# Patient Record
Sex: Male | Born: 1945 | Race: White | Hispanic: No | Smoking: Never smoker
Health system: Southern US, Community
[De-identification: ages and names within clinical notes are randomized; demographics above are authoritative.]

## PROBLEM LIST (undated history)

## (undated) DIAGNOSIS — Q211 Atrial septal defect, unspecified: Secondary | ICD-10-CM

## (undated) DIAGNOSIS — I639 Cerebral infarction, unspecified: Secondary | ICD-10-CM

## (undated) DIAGNOSIS — I10 Essential (primary) hypertension: Secondary | ICD-10-CM

## (undated) HISTORY — DX: Atrial septal defect, unspecified: Q21.10

## (undated) HISTORY — DX: Essential (primary) hypertension: I10

## (undated) HISTORY — DX: Cerebral infarction, unspecified: I63.9

## (undated) HISTORY — DX: Atrial septal defect: Q21.1

## (undated) HISTORY — PX: SPLENECTOMY: SUR1306

## (undated) HISTORY — PX: BACK SURGERY: SHX140

## (undated) HISTORY — PX: KNEE SURGERY: SHX244

---

## 2018-02-01 DIAGNOSIS — I639 Cerebral infarction, unspecified: Secondary | ICD-10-CM

## 2018-02-02 DIAGNOSIS — I639 Cerebral infarction, unspecified: Secondary | ICD-10-CM | POA: Diagnosis not present

## 2018-02-02 DIAGNOSIS — I6789 Other cerebrovascular disease: Secondary | ICD-10-CM | POA: Diagnosis not present

## 2018-02-03 DIAGNOSIS — I6789 Other cerebrovascular disease: Secondary | ICD-10-CM | POA: Diagnosis not present

## 2018-02-03 DIAGNOSIS — I639 Cerebral infarction, unspecified: Secondary | ICD-10-CM | POA: Diagnosis not present

## 2018-02-15 DIAGNOSIS — R471 Dysarthria and anarthria: Secondary | ICD-10-CM | POA: Diagnosis not present

## 2018-02-16 ENCOUNTER — Telehealth: Payer: Self-pay | Admitting: Neurology

## 2018-02-16 DIAGNOSIS — R471 Dysarthria and anarthria: Secondary | ICD-10-CM | POA: Diagnosis not present

## 2018-02-16 NOTE — Telephone Encounter (Signed)
I was called by Dr. Thedore MinsSingh in [facility] and asking for new referral to see this pt earlier after discharge. I told him that we can schedule on 4/16 or 4/17. He is fine with it. He has cardiology appointment on 03/03/15.   Hi, Kathie RhodesBetty or Lurena JoinerRebecca, could you please schedule this pt with me on 4/16 or 4/17? Thanks much.   Marvel PlanJindong Abri Vacca, MD PhD Stroke Neurology 02/16/2018 8:37 AM

## 2018-03-01 DIAGNOSIS — I639 Cerebral infarction, unspecified: Secondary | ICD-10-CM | POA: Insufficient documentation

## 2018-03-01 DIAGNOSIS — Q211 Atrial septal defect, unspecified: Secondary | ICD-10-CM | POA: Insufficient documentation

## 2018-03-01 DIAGNOSIS — I10 Essential (primary) hypertension: Secondary | ICD-10-CM | POA: Insufficient documentation

## 2018-03-02 ENCOUNTER — Ambulatory Visit: Payer: Medicare Other | Admitting: Physician Assistant

## 2018-03-02 ENCOUNTER — Encounter: Payer: Self-pay | Admitting: Physician Assistant

## 2018-03-02 VITALS — BP 118/80 | HR 69 | Ht 69.0 in | Wt 178.8 lb

## 2018-03-02 DIAGNOSIS — I10 Essential (primary) hypertension: Secondary | ICD-10-CM

## 2018-03-02 DIAGNOSIS — I639 Cerebral infarction, unspecified: Secondary | ICD-10-CM

## 2018-03-02 DIAGNOSIS — Q211 Atrial septal defect, unspecified: Secondary | ICD-10-CM

## 2018-03-02 MED ORDER — METOPROLOL TARTRATE 50 MG PO TABS: ORAL_TABLET | ORAL | 0 refills | Status: DC

## 2018-03-02 NOTE — Patient Instructions (Addendum)
Medication Instructions:  Your physician recommends that you continue on your current medications as directed. Please refer to the Current Medication list given to you today.  Labwork: Your physician recommends that you return for lab work one week prior to cardiac CT for BMET.  Testing/Procedures: Your physician has requested that you have cardiac CT. Cardiac computed tomography (CT) is a painless test that uses an x-ray machine to take clear, detailed pictures of your heart. For further information please visit [email or web address] Please follow instruction sheet as given.  Follow-Up: Your physician wants you to follow-up with Dr. Excell Seltzerooper, his nurse will call you with an appointment.  Your physician recommends that you schedule a follow-up appointment in lipid clinic.  If you need a refill on your cardiac medications before your next appointment, please call your pharmacy.   Please arrive at the [facility] main entrance of [facility] at xx:xx AM (30-45 minutes prior to test start time)  [facility] [address] [place], [place] [address] [phone]  Proceed to the [facility] Radiology Department (First Floor).  Please follow these instructions carefully (unless otherwise directed):  On the Night Before the Test: . Drink plenty of water. . Do not consume any caffeinated/decaffeinated beverages or chocolate 12 hours prior to your test. . Do not take any antihistamines 12 hours prior to your test. . If not on a Beta Blocker- Take 50 mg of lopressor (metoprolol) the evening before test. .  On the Day of the Test: . Drink plenty of water. Do not drink any water within one hour of the test. . Do not eat any food 4 hours prior to the test. . You may take your regular medications prior to the test. . IF NOT ON A BETA BLOCKER - Take 50 mg of lopressor (metoprolol) one hour before the test.  After the Test: . Drink plenty of water. . After receiving IV  contrast, you may experience a mild flushed feeling. This is normal. . On occasion, you may experience a mild rash up to 24 hours after the test. This is not dangerous. If this occurs, you can take Benadryl 25 mg and increase your fluid intake. . If you experience trouble breathing, this can be serious. If it is severe call 911 IMMEDIATELY. If it is mild, please call our office.

## 2018-03-02 NOTE — Progress Notes (Signed)
Cardiology Office Note    Date:  03/02/2018   ID:  Marcus Wise, DOB 1945-11-28, MRN [id number]  PCP:  Doreene Elandhomas, Millard B, MD  Cardiologist: No primary care provider on file.  Chief Complaint  Patient presents with  . New Patient (Initial Visit)    History of Present Illness:  Marcus Wise is a 72 y.o. 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  He was initial scratch that he was placed on Eliquis then switched to Xarelto and a statin as well as lisinopril.  [facility] with abnormal gait and balance as well as visual disturbance.  CT of the head MRI and MRA were unremarkable.  Ig no acute changes.  He was already on Xarelto and will follow up with Dr. Roda ShuttersXU.  He had resolution of his symptoms.  [facility]D [facility] L[facility]F 55-60% with mild enlargement of the right atrium and moderate second atrial septal defect measuring 11 and 19 mm in diameter.  TEE small sinus venous ASD measuring under 10 mm, small second him ASD measuring under 10 mm.  Carotid duplex minimal to moderate bilateral plaque not hemodynamically significant.  Lower extremity Dopplers no DVT.  Creatinine 1.3 hemoglobin A1c 5.3.  Patient comes in today accompanied by his daughter.  He is very active and runs 1-2 miles interval training every other day.  He has never smoked.  Denies chest pain, pressure, dyspnea, dyspnea on exertion dizziness or presyncope.  Never knew he had a heart murmur until now.  Complains of diarrhea on Lipitor.    Past Medical History:  Diagnosis Date  . ASD (atrial septal defect)   . 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 Hypertension     History reviewed. No pertinent surgical history.  Current Medications: Current Meds  Medication Sig  . atorvastatin (LIPITOR) 80 MG tablet Take  80 mg by mouth daily.  . ramipril (ALTACE) 10 MG capsule Take 1 capsule by mouth daily.  . rivaroxaban (XARELTO) 20 MG TABS tablet Take 20 mg by mouth daily with supper.     Allergies:   Patient has no allergy information on record.   Social History   Socioeconomic History  . Marital status: Married    Spouse name: Not on file  . Number of children: Not on file  . Years of education: Not on file  . Highest education level: Not on file  Occupational History  . Not on file  Social Needs  . Financial resource strain: Not on file  . Food insecurity:    Worry: Not on file    Inability: Not on file  . Transportation needs:    Medical: Not on file    Non-medical: Not on file  Tobacco Use  . Smoking status: Never Smoker  . Smokeless tobacco: Never Used  Substance and Sexual Activity  . Alcohol use: Not on file  . Drug use: Not on file  . Sexual activity: Not on file  Lifestyle  . Physical activity:    Days per week: Not on file    Minutes per session: Not on file  . Stress: Not on file  Relationships  . Social connections:    Talks on phone: Not on file    Gets together: Not on file    Attends religious  service: Not on file    Active member of club or organization: Not on file    Attends meetings of clubs or organizations: Not on file    Relationship status: Not on file  Other Topics Concern  . Not on file  Social History Narrative  . Not on file     Family History:  The patient's family history includes Heart attack in his father.   ROS:   Please see the history of present illness.    Review of Systems  Constitution: Negative.  HENT: Negative.   Cardiovascular: Negative.   Respiratory: Negative.   Endocrine: Negative.   Hematologic/Lymphatic: Negative.   Musculoskeletal: Negative.   Gastrointestinal: Positive for diarrhea.  Genitourinary: Negative.   Neurological: Negative.    All other systems reviewed and are negative.   PHYSICAL EXAM:   VS:  BP 118/80    Pulse 69   Ht 5\' 9"  (1.753 m)   Wt 178 lb 12.8 oz (81.1 kg)   SpO2 97%   BMI 26.40 kg/m   Physical Exam  GEN: Well nourished, well developed, in no acute distress  Neck: no JVD, carotid bruits, or masses Cardiac:RRR; 2/6 systolic murmur at the left sternal border Respiratory:  clear to auscultation bilaterally, normal work of breathing GI: soft, nontender, nondistended, + BS Ext: without cyanosis, clubbing, or edema, Good distal pulses bilaterally Neuro:  Alert and Oriented x 3,  Psych: euthymic mood, full affect  Wt Readings from Last 3 Encounters:  03/02/18 178 lb 12.8 oz (81.1 kg)      Studies/Labs Reviewed:   EKG:  EKG is not ordered today.  The ekg reviewed from 02/15/18 sinus bradycardia at 54 bpm with incomplete right bundle branch block otherwise normal Recent Labs: No results found for requested labs within last 8760 hours.   Lipid Panel No results found for: CHOL, TRIG, HDL, CHOLHDL, VLDL, LDLCALC, LDLDIRECT  Additional studies/ records that were reviewed today include:  Records reviewed from [facility].  We will be obtaining actual 2D echo and TEE to review in detail.  CT carotid Dopplers and MRI were also reviewed.    ASSESSMENT:    1. ASD (atrial septal defect)   2. Cerebrovascular accident (CVA), unspecified mechanism (HCC)   3. Essential hypertension      PLAN:  In order of problems listed above:  ASD small sinus venous ASD measuring under 10 mm and small secondum ASD under 10 mm found on TEE and 2D echo at [facility] in the setting of small CVA.  Patient currently on Xarelto and Lipitor.  Discussed with Dr. Delton See in detail.  She said the small sinus venous ASD is difficult to see on TEE and recommends CT scan and follow-up with Dr. Excell Seltzer to discuss possible closure.  CVA 02/06/18 small right posterior frontal, recurrent symptoms 02/16/18 but no change in CT or MRI or MRA.  Dr. Roda Shutters recommended continuing Xarelto and Lipitor.  Patient  having trouble on Lipitor with diarrhea.  Will refer to our lipid clinic to discuss alternatives.  Hypertension well controlled on ramipril.  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  Last creatinine 1.3.  Followed by Dr. Geanie Berlin who says that is probably normal for his muscle mass according to the patient.  Will watch closely with CT scan    Medication Adjustments/Labs and Tests Ordered: Current medicines are reviewed at length with the patient today.  Concerns regarding medicines are outlined above.  Medication changes, Labs and Tests ordered today are  listed in the Patient Instructions below. Patient Instructions  Medication Instructions:  Your physician recommends that you continue on your current medications as directed. Please refer to the Current Medication list given to you today.  Labwork: Your physician recommends that you return for lab work one week prior to cardiac CT for BMET.  Testing/Procedures: Your physician has requested that you have cardiac CT. Cardiac computed tomography (CT) is a painless test that uses an x-ray machine to take clear, detailed pictures of your heart. For further information please visit [email or web address] Please follow instruction sheet as given.  Follow-Up: Your physician wants you to follow-up with Dr. Excell Seltzer, his nurse will call you with an appointment.  Your physician recommends that you schedule a follow-up appointment in lipid clinic.  If you need a refill on your cardiac medications before your next appointment, please call your pharmacy.   Please arrive at the [facility] main entrance of [facility] at xx:xx AM (30-45 minutes prior to test start time)  [facility] [address] [place], [place] [address] [phone]  Proceed to the [facility] Radiology Department (First Floor).  Please follow these instructions carefully (unless otherwise directed):  On the Night Before the Test: . Drink plenty of  water. . Do not consume any caffeinated/decaffeinated beverages or chocolate 12 hours prior to your test. . Do not take any antihistamines 12 hours prior to your test. . If not on a Beta Blocker- Take 50 mg of lopressor (metoprolol) the evening before test. .  On the Day of the Test: . Drink plenty of water. Do not drink any water within one hour of the test. . Do not eat any food 4 hours prior to the test. . You may take your regular medications prior to the test. . IF NOT ON A BETA BLOCKER - Take 50 mg of lopressor (metoprolol) one hour before the test.  After the Test: . Drink plenty of water. . After receiving IV contrast, you may experience a mild flushed feeling. This is normal. . On occasion, you may experience a mild rash up to 24 hours after the test. This is not dangerous. If this occurs, you can take Benadryl 25 mg and increase your fluid intake. . If you experience trouble breathing, this can be serious. If it is severe call 911 IMMEDIATELY. If it is mild, please call our office.     Elson Clan, PA-C  03/02/2018 12:53 PM    [facility] [address] [place], [place], [place]  [address] Phone: [phone]; Fax: [phone]

## 2018-03-03 ENCOUNTER — Other Ambulatory Visit: Payer: Self-pay | Admitting: *Deleted

## 2018-03-03 MED ORDER — ATORVASTATIN CALCIUM 80 MG PO TABS: 80.0000 mg | ORAL_TABLET | Freq: Every day | ORAL | 1 refills | Status: DC

## 2018-03-03 MED ORDER — RIVAROXABAN 20 MG PO TABS: 20.0000 mg | ORAL_TABLET | Freq: Every day | ORAL | 1 refills | Status: DC

## 2018-03-09 ENCOUNTER — Ambulatory Visit: Payer: Self-pay | Admitting: Neurology

## 2018-03-19 NOTE — Progress Notes (Signed)
TEE and surface echo images reviewed. There is clearly an ASD in the secundum portion of the septum. It is not clear to me whether there is a sinus venosus ASD. I recommend a gated cardiac CTA to better evaluate for anomalous veins and integrity of interatrial septum. I would like to see in consultation after the CTA is completed. thx

## 2018-03-25 ENCOUNTER — Encounter: Payer: Self-pay | Admitting: Pharmacist

## 2018-03-25 ENCOUNTER — Other Ambulatory Visit: Payer: Self-pay | Admitting: Cardiovascular Disease

## 2018-03-25 ENCOUNTER — Ambulatory Visit (INDEPENDENT_AMBULATORY_CARE_PROVIDER_SITE_OTHER): Payer: Medicare Other | Admitting: Pharmacist

## 2018-03-25 ENCOUNTER — Other Ambulatory Visit: Payer: Medicare Other | Admitting: *Deleted

## 2018-03-25 DIAGNOSIS — E785 Hyperlipidemia, unspecified: Secondary | ICD-10-CM | POA: Insufficient documentation

## 2018-03-25 DIAGNOSIS — Q211 Atrial septal defect, unspecified: Secondary | ICD-10-CM

## 2018-03-25 LAB — BASIC METABOLIC PANEL
BUN/Creatinine Ratio: 15 (ref 10–24)
BUN: 18 mg/dL (ref 8–27)
CHLORIDE: 102 mmol/L (ref 96–106)
CO2: 22 mmol/L (ref 20–29)
Calcium: 9.9 mg/dL (ref 8.6–10.2)
Creatinine, Ser: 1.21 mg/dL (ref 0.76–1.27)
GFR calc Af Amer: 69 mL/min/{1.73_m2} (ref >59)
GFR calc non Af Amer: 59 mL/min/{1.73_m2} — ABNORMAL LOW (ref >59)
GLUCOSE: 97 mg/dL (ref 65–99)
POTASSIUM: 4.4 mmol/L (ref 3.5–5.2)
Sodium: 138 mmol/L (ref 134–144)

## 2018-03-25 MED ORDER — ROSUVASTATIN CALCIUM 40 MG PO TABS: 40.0000 mg | ORAL_TABLET | Freq: Every day | ORAL | 3 refills | Status: DC

## 2018-03-25 NOTE — Progress Notes (Signed)
Patient ID: Marcus Wise                 DOB: 1945/12/27                    MRN: [id number]     HPI: Marcus Wise is a 72 y.o. male patient of Dr. Excell Seltzer that presents today for lipid evaluation.  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 He has reported diarrhea with atorvastatin.   He presents today for discussion of cholesterol. He reports that since starting the atorvastatin he has had diarrhea. He has been eating more cheese and dairy to combat this. 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 He reports some itching on his trunk, but no noticeable rash with atorvastatin as well. Despite all of this he has been compliant with the medication and is willing to continue if necessary.  He does also note that he is much more tired than usual and is unsure if this is related to the medication or the diarrhea caused by the medication.   He also is interested in learning about diets that are both good for sugars and for cholesterol.    Risk Factors: CVA, HTN LDL Goal: <70  Current Medications: atorvastatin  daily Intolerances: atorvastatin (diarrhea)  Diet: Does eat hamburger mostly. He does eat a lot of salads. He does eat herron more recently. He eats broccoli and apples. Drinks water and occasional Gatorade and chocolate milk.   Exercise: He runs, plays racket ball, yard work.   Family History: Heart attack in his father.  Social History: denies tobacco - never used. 1-2 ounces per day.   Labs: 03/15/18: TC 117, TG 173, HDL 41, LDL 41 (atorvastatin  daily)  Past Medical History:  Diagnosis Date  . ASD (atrial septal defect)   . 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 Hypertension     Current Outpatient Medications on File Prior  to Visit  Medication Sig Dispense Refill  . atorvastatin (LIPITOR) 80 MG tablet Take 1 tablet (80 mg total) by mouth daily. 30 tablet 1  . metoprolol tartrate (LOPRESSOR) 50 MG tablet Take one tablet the night before Cardiac CT and Take one tablet the morning of Cardiac CT. 2 tablet 0  . ramipril (ALTACE) 10 MG capsule Take 1 capsule by mouth daily.    . rivaroxaban (XARELTO) 20 MG TABS tablet Take 1 tablet (20 mg total) by mouth daily with supper. 90 tablet 1   No current facility-administered medications on file prior to visit.     Not on File  Assessment/Plan: Hyperlipidemia: LDL at goal on atorvastatin. However will discontinue atorvastatin therapy due to GI symptoms. Will start rosuvastatin  daily (which tends to be less associated with diarrhea and more associated with constipation). Advised he call with tolerance issues and especially if itching gets worse with medication change. He believes that his primary MD will order lipid panel with follow up visit, but he is aware to call if not so that we can check labs for him.   Will also forward along information on ketogenic diet when available.   Thank you,  Freddie Apley. Cleatis Polka, PharmD  [facility]  03/25/2018 8:50 AM

## 2018-03-25 NOTE — Patient Instructions (Addendum)
STOP atorvastatin  START rosuvastatin (Crestor)  daily   Call [phone] with any issues or question. Call us in 2-3 months to have lipid panel ordered if not planned at primary doctor.    Cholesterol Cholesterol is a fat. Your body needs a small amount of cholesterol. Cholesterol (plaque) may build up in your blood vessels (arteries). That makes you more likely to have a heart attack or stroke. You cannot feel your cholesterol level. Having a blood test is the only way to find out if your level is high. Keep your test results. Work with your doctor to keep your cholesterol at a good level. What do the results mean?  Total cholesterol is how much cholesterol is in your blood.  LDL is bad cholesterol. This is the type that can build up. Try to have low LDL.  HDL is good cholesterol. It cleans your blood vessels and carries LDL away. Try to have high HDL.  Triglycerides are fat that the body can store or burn for energy. What are good levels of cholesterol?  Total cholesterol below 200.  LDL below 100 is good for people who have health risks. LDL below 70 is good for people who have very high risks.  HDL above 40 is good. It is best to have HDL of 60 or higher.  Triglycerides below 150. How can I lower my cholesterol? Diet Follow your diet program as told by your doctor.  Choose fish, white meat chicken, or [place] that is roasted or baked. Try not to eat red meat, fried foods, sausage, or lunch meats.  Eat lots of fresh fruits and vegetables.  Choose whole grains, beans, pasta, potatoes, and cereals.  Choose olive oil, corn oil, or canola oil. Only use small amounts.  Try not to eat butter, mayonnaise, shortening, or palm kernel oils.  Try not to eat foods with trans fats.  Choose low-fat or nonfat dairy foods. ? Drink skim or nonfat milk. ? Eat low-fat or nonfat yogurt and cheeses. ? Try not to drink whole milk or cream. ? Try not to eat ice cream, egg yolks, or  full-fat cheeses.  Healthy desserts include angel food cake, ginger snaps, animal crackers, hard candy, popsicles, and low-fat or nonfat frozen yogurt. Try not to eat pastries, cakes, pies, and cookies.  Exercise Follow your exercise program as told by your doctor.  Be more active. Try gardening, walking, and taking the stairs.  Ask your doctor about ways that you can be more active.  Medicine  Take over-the-counter and prescription medicines only as told by your doctor. This information is not intended to replace advice given to you by your health care provider. Make sure you discuss any questions you have with your health care provider. Document Released: 02/06/2009 Document Revised: 06/11/2016 Document Reviewed: 05/22/2016 Elsevier Interactive Patient Education  [employer].

## 2018-03-26 NOTE — Progress Notes (Signed)
Pt has been made aware of normal result and verbalized understanding.  jw 03/26/18

## 2018-04-01 ENCOUNTER — Encounter (INDEPENDENT_AMBULATORY_CARE_PROVIDER_SITE_OTHER): Payer: Self-pay

## 2018-04-08 ENCOUNTER — Ambulatory Visit (HOSPITAL_COMMUNITY): Payer: Medicare Other

## 2018-04-12 ENCOUNTER — Ambulatory Visit (HOSPITAL_COMMUNITY)
Admission: RE | Admit: 2018-04-12 | Discharge: 2018-04-12 | Disposition: A | Discharge status: Home / Self Care | Payer: Medicare Other | Source: Ambulatory Visit | Attending: Physician Assistant | Admitting: Physician Assistant

## 2018-04-12 ENCOUNTER — Ambulatory Visit (HOSPITAL_COMMUNITY): Admission: RE | Admit: 2018-04-12 | Payer: Medicare Other | Source: Ambulatory Visit

## 2018-04-12 DIAGNOSIS — Q211 Atrial septal defect, unspecified: Secondary | ICD-10-CM

## 2018-04-12 DIAGNOSIS — R079 Chest pain, unspecified: Secondary | ICD-10-CM | POA: Diagnosis not present

## 2018-04-12 MED ORDER — IOPAMIDOL (ISOVUE-370) INJECTION 76%
INTRAVENOUS | Status: AC
  Filled 2018-04-12: qty 100

## 2018-04-12 MED ORDER — NITROGLYCERIN 0.4 MG SL SUBL
SUBLINGUAL_TABLET | SUBLINGUAL | Status: AC
  Administered 2018-04-12: 0.8 mg
  Filled 2018-04-12: qty 2

## 2018-04-12 MED ORDER — IOPAMIDOL (ISOVUE-370) INJECTION 76%
80.0000 mL | Freq: Once | INTRAVENOUS | Status: AC | PRN
  Administered 2018-04-12: 60 mL via INTRAVENOUS

## 2018-04-13 ENCOUNTER — Telehealth: Payer: Self-pay

## 2018-04-13 NOTE — Telephone Encounter (Signed)
Tonny Bollman, MD  Henrietta Dine, RN        Thanks let's set him up for consult and ASD closure.      Called to schedule consult and tentative ASD repair 6/20. Left message to call back.

## 2018-04-13 NOTE — Telephone Encounter (Signed)
Scheduled patient for consult with Dr. Excell Seltzer 6/7. He was grateful for call and agrees with treatment plan.

## 2018-04-13 NOTE — Telephone Encounter (Signed)
-----   Message from Dyann Kief, PA-C sent at 04/13/2018  7:55 AM EDT ----- Let patient know calcium score is 0, no coronary disease and small secundum ASD. Recent CVA. Needs appt with Dr. Excell Seltzer. Thanks

## 2018-04-26 ENCOUNTER — Ambulatory Visit (HOSPITAL_COMMUNITY): Payer: Medicare Other

## 2018-04-30 ENCOUNTER — Ambulatory Visit: Payer: Medicare Other | Admitting: Cardiovascular Disease

## 2018-04-30 ENCOUNTER — Encounter: Payer: Self-pay | Admitting: Cardiovascular Disease

## 2018-04-30 VITALS — BP 158/90 | HR 69 | Ht 69.0 in | Wt 186.0 lb

## 2018-04-30 DIAGNOSIS — Q211 Atrial septal defect, unspecified: Secondary | ICD-10-CM

## 2018-04-30 MED ORDER — ASPIRIN EC 81 MG PO TBEC: 81.0000 mg | DELAYED_RELEASE_TABLET | Freq: Every day | ORAL | 3 refills | Status: DC

## 2018-04-30 NOTE — H&P (View-Only) (Signed)
Cardiology Office Note Date:  04/30/2018   ID:  Marcus Wise, DOB January 05, 1946, MRN [id number]  PCP:  [name], [name], PA  Cardiologist:  Tonny BollmanMichael Breda Bond, MD    Chief Complaint  Patient presents with  . Atrial Septal Defect     History of Present Illness: Marcus Wise is a 72 y.o. male who presents for evaluation of an atrial septal defect.   He presented with left arm numbness and left leg clumsiness and was diagnosed with a stroke in March 2019. He has been on rivaroxaban since that time. He had a DVT in 2018 and was treated with oral anticoagulation for 3-6 months (he doesn't remember duration). 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   The patient is here with his daughter today.  He is doing relatively well and tolerating oral anticoagulation with rivaroxaban without bleeding problems.  He has had trouble tolerating a statin drug and he is recently reduce the dose by 50% now feeling a little better.  He describes leg pain and weakness.  He denies chest pain or shortness of breath.  He denies lightheadedness or syncope.  The patient has no history of heart murmur, cardiac surgery, or previous cardiac evaluation.  He was initially diagnosed with an atrial septal defect just recently when he presented with his stroke.  There is a family history of ASD in his niece.  Past Medical History:  Diagnosis Date  . ASD (atrial septal defect)   . 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 Hypertension     Past Surgical History:  Procedure Laterality Date  . BACK SURGERY    . KNEE SURGERY    . SPLENECTOMY      Current Outpatient Medications  Medication Sig Dispense Refill  . ramipril (ALTACE) 10 MG capsule Take 1 capsule by mouth daily.    . rivaroxaban (XARELTO) 20 MG TABS tablet Take 1 tablet (20 mg total) by mouth daily with supper. 90 tablet 1  .  rosuvastatin (CRESTOR) 40 MG tablet Take 1 tablet (40 mg total) by mouth daily. 90 tablet 3  . aspirin EC 81 MG tablet Take 1 tablet (81 mg total) by mouth daily. 90 tablet 3   No current facility-administered medications for this visit.     Allergies:   Patient has no allergy information on record.   Social History:  The patient  reports that he has never smoked. He has never used smokeless tobacco.   Family History:  The patient's  family history includes Heart attack in his father.    ROS:  Please see the history of present illness.   Positive for diarrhea, muscle cramps, rash, dizziness, fatigue, leg pain, heart palpitations, anxiety, balance problems.  All other systems are reviewed and negative.    PHYSICAL EXAM: VS:  BP (!) 158/90   Pulse 69   Ht 5\' 9"  (1.753 m)   Wt 186 lb (84.4 kg)   SpO2 98%   BMI 27.47 kg/m  , BMI Body mass index is 27.47 kg/m. GEN: Well nourished, well developed, in no acute distress  HEENT: normal  Neck: no JVD, no masses. No carotid bruits Cardiac: RRR without murmur or gallop                Respiratory:  clear to auscultation bilaterally, normal work of breathing GI: soft,  nontender, nondistended, + BS MS: no deformity or atrophy  Ext: no pretibial edema, pedal pulses 2+= bilaterally Skin: warm and dry, no rash Neuro:  Strength and sensation are intact Psych: euthymic mood, full affect  EKG:  EKG is not ordered today.  Recent Labs: 03/25/2018: BUN 18; Creatinine, Ser 1.21; Potassium 4.4; Sodium 138   Lipid Panel  No results found for: CHOL, TRIG, HDL, CHOLHDL, VLDL, LDLCALC, LDLDIRECT    Wt Readings from Last 3 Encounters:  04/30/18 186 lb (84.4 kg)  03/02/18 178 lb 12.8 oz (81.1 kg)     Cardiac Studies Reviewed: 2D echocardiogram dated 02/02/2018 demonstrates normal LV systolic function, a moderate secundum ASD, mild RV and RA enlargement.  Gated cardiac CTA demonstrates a coronary artery calcium score of 0, no coronary artery  disease, a secundum ASD measuring 10x10 mm, and no anomalous pulmonary vein drainage.  ASSESSMENT AND PLAN: Secundum ASD: The patient appears to have a hemodynamically significant atrial septal defect with evidence of right-sided cardiac chamber enlargement.  I have personally reviewed the images from his surface echocardiogram, transesophageal echocardiogram, and gated cardiac CTA.  He appears to have a 10 mm secundum ASD.  There is no evidence of anomalous pulmonary vein drainage.  The TEE raised the question of a venosus type defect, but I do not appreciate any convincing color-flow evidence of this.  This is not visualized on cardiac CTA.  I reviewed management options with the patient.  We discussed risks, indications, and alternatives to transcatheter ASD closure.  I demonstrated the device to him today.  He understands risks of serious complications such as stroke, heart attack, device embolization, cardiac perforation and tamponade, emergency cardiac surgery, serious arrhythmia, and death occur at a rate of approximately 1%.  He would like to move forward and schedule the procedure as soon as possible.  He will continue on rivaroxaban and I have asked him to hold this the day before and day of the procedure.  We will add aspirin 81 mg for a period of about 3 months.  Will defer to his outpatient neurologist and primary physician regarding long-term anticoagulation. For his ASD device he will require antiplatelet Rx with ASA for a period of 3 months.   Current medicines are reviewed with the patient today.  The patient does not have concerns regarding medicines.  Labs/ tests ordered today include:   Orders Placed This Encounter  Procedures  . Basic metabolic panel  . CBC with Differential/Platelet    Disposition:   To be scheduled for transcatheter ASD closure.  Signed, Tonny Bollman, MD  04/30/2018 5:30 PM    [facility] [address] [place], [place], [place]   [address] Phone: [phone]; Fax: [phone]

## 2018-04-30 NOTE — Progress Notes (Signed)
Cardiology Office Note Date:  04/30/2018   ID:  Marcus Wise, DOB January 05, 1946, MRN [id number]  PCP:  Marcus CorporalYates, [name], PA  Cardiologist:  Marcus BollmanMichael Deseri Loss, MD    Chief Complaint  Patient presents with  . Atrial Septal Defect     History of Present Illness: Marcus Wise is a 72 y.o. male who presents for evaluation of an atrial septal defect.   He presented with left arm numbness and left leg clumsiness and was diagnosed with a stroke in March 2019. He has been on rivaroxaban since that time. He had a DVT in 2018 and was treated with oral anticoagulation for 3-6 months (he doesn't remember duration). 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   The patient is here with his daughter today.  He is doing relatively well and tolerating oral anticoagulation with rivaroxaban without bleeding problems.  He has had trouble tolerating a statin drug and he is recently reduce the dose by 50% now feeling a little better.  He describes leg pain and weakness.  He denies chest pain or shortness of breath.  He denies lightheadedness or syncope.  The patient has no history of heart murmur, cardiac surgery, or previous cardiac evaluation.  He was initially diagnosed with an atrial septal defect just recently when he presented with his stroke.  There is a family history of ASD in his niece.  Past Medical History:  Diagnosis Date  . ASD (atrial septal defect)   . 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 Hypertension     Past Surgical History:  Procedure Laterality Date  . BACK SURGERY    . KNEE SURGERY    . SPLENECTOMY      Current Outpatient Medications  Medication Sig Dispense Refill  . ramipril (ALTACE) 10 MG capsule Take 1 capsule by mouth daily.    . rivaroxaban (XARELTO) 20 MG TABS tablet Take 1 tablet (20 mg total) by mouth daily with supper. 90 tablet 1  .  rosuvastatin (CRESTOR) 40 MG tablet Take 1 tablet (40 mg total) by mouth daily. 90 tablet 3  . aspirin EC 81 MG tablet Take 1 tablet (81 mg total) by mouth daily. 90 tablet 3   No current facility-administered medications for this visit.     Allergies:   Patient has no allergy information on record.   Social History:  The patient  reports that he has never smoked. He has never used smokeless tobacco.   Family History:  The patient's  family history includes Heart attack in his father.    ROS:  Please see the history of present illness.   Positive for diarrhea, muscle cramps, rash, dizziness, fatigue, leg pain, heart palpitations, anxiety, balance problems.  All other systems are reviewed and negative.    PHYSICAL EXAM: VS:  BP (!) 158/90   Pulse 69   Ht 5\' 9"  (1.753 m)   Wt 186 lb (84.4 kg)   SpO2 98%   BMI 27.47 kg/m  , BMI Body mass index is 27.47 kg/m. GEN: Well nourished, well developed, in no acute distress  HEENT: normal  Neck: no JVD, no masses. No carotid bruits Cardiac: RRR without murmur or gallop                Respiratory:  clear to auscultation bilaterally, normal work of breathing GI: soft,  nontender, nondistended, + BS MS: no deformity or atrophy  Ext: no pretibial edema, pedal pulses 2+= bilaterally Skin: warm and dry, no rash Neuro:  Strength and sensation are intact Psych: euthymic mood, full affect  EKG:  EKG is not ordered today.  Recent Labs: 03/25/2018: BUN 18; Creatinine, Ser 1.21; Potassium 4.4; Sodium 138   Lipid Panel  No results found for: CHOL, TRIG, HDL, CHOLHDL, VLDL, LDLCALC, LDLDIRECT    Wt Readings from Last 3 Encounters:  04/30/18 186 lb (84.4 kg)  03/02/18 178 lb 12.8 oz (81.1 kg)     Cardiac Studies Reviewed: 2D echocardiogram dated 02/02/2018 demonstrates normal LV systolic function, a moderate secundum ASD, mild RV and RA enlargement.  Gated cardiac CTA demonstrates a coronary artery calcium score of 0, no coronary artery  disease, a secundum ASD measuring 10x10 mm, and no anomalous pulmonary vein drainage.  ASSESSMENT AND PLAN: Secundum ASD: The patient appears to have a hemodynamically significant atrial septal defect with evidence of right-sided cardiac chamber enlargement.  I have personally reviewed the images from his surface echocardiogram, transesophageal echocardiogram, and gated cardiac CTA.  He appears to have a 10 mm secundum ASD.  There is no evidence of anomalous pulmonary vein drainage.  The TEE raised the question of a venosus type defect, but I do not appreciate any convincing color-flow evidence of this.  This is not visualized on cardiac CTA.  I reviewed management options with the patient.  We discussed risks, indications, and alternatives to transcatheter ASD closure.  I demonstrated the device to him today.  He understands risks of serious complications such as stroke, heart attack, device embolization, cardiac perforation and tamponade, emergency cardiac surgery, serious arrhythmia, and death occur at a rate of approximately 1%.  He would like to move forward and schedule the procedure as soon as possible.  He will continue on rivaroxaban and I have asked him to hold this the day before and day of the procedure.  We will add aspirin 81 mg for a period of about 3 months.  Will defer to his outpatient neurologist and primary physician regarding long-term anticoagulation. For his ASD device he will require antiplatelet Rx with ASA for a period of 3 months.   Current medicines are reviewed with the patient today.  The patient does not have concerns regarding medicines.  Labs/ tests ordered today include:   Orders Placed This Encounter  Procedures  . Basic metabolic panel  . CBC with Differential/Platelet    Disposition:   To be scheduled for transcatheter ASD closure.  Signed, Marcus Bollman, MD  04/30/2018 5:30 PM    [facility] [address] [place], [place], [place]   [address] Phone: [phone]; Fax: [phone]

## 2018-04-30 NOTE — Patient Instructions (Addendum)
Medication Instructions:  1) START ASPIRIN 81 mg daily  Labwork: TODAY: BMET, CBC  Testing/Procedures: Dr. Excell Seltzerooper recommends you have an ASD CLOSURE on May 13, 2018. Please see page 2 for more details.  Follow-Up: You have a 1 MONTH  follow-up appointment scheduled 06/10/2018 at 1:30PM.  Any Other Special Instructions Will Be Listed Below (If Applicable).   [address] [facility] [address] OFFICE [address], [address] [place] [address] Dept: [phone] Loc: [phone]  Macie BurowsJames Y Kimmey  04/30/2018  You are scheduled for a, ASD CLOSURE on Thursday, June 20 with Dr. Tonny BollmanMichael Cooper.  1. [facility]: [address] [place], [place] [address] at 7:30 AM (two hours before your procedure to ensure your preparation). Free valet parking service is available.   Special note: Every effort is made to have your procedure done on time. Please understand that emergencies sometimes delay scheduled procedures.  2. Diet: Drink plenty of water the day before your test! Do not eat any solid foods after midnight the night before. You may have clear liquids until 5:00AM the morning of your procedure.  3. Labs: Today!  4. Medication instructions in preparation for your procedure:  1) HOLD XARELTO the day before and day of your procedure.  2) Take your ASPIRIN and other medications as directed with sips of water.  5. Plan for one night stay--bring personal belongings. 6. Bring a current list of your medications and current insurance cards. 7. You MUST have a responsible person to drive you home. 8. Someone MUST be with you the first 24 hours after you arrive home or your discharge will be delayed. 9. Please wear clothes that are easy to get on and off and wear slip-on shoes.  Thank you for allowing us to care for you!   -- [facility]  [facility]     If you need a refill on your cardiac medications before your next appointment, please call your pharmacy.

## 2018-05-01 LAB — CBC WITH DIFFERENTIAL/PLATELET
BASOS ABS: 0 10*3/uL (ref 0.0–0.2)
Basos: 0 %
EOS (ABSOLUTE): 0.2 10*3/uL (ref 0.0–0.4)
Eos: 4 %
HEMOGLOBIN: 15 g/dL (ref 13.0–17.7)
Hematocrit: 43.9 % (ref 37.5–51.0)
IMMATURE GRANS (ABS): 0 10*3/uL (ref 0.0–0.1)
IMMATURE GRANULOCYTES: 0 %
LYMPHS: 28 %
Lymphocytes Absolute: 1.9 10*3/uL (ref 0.7–3.1)
MCH: 33.3 pg — AB (ref 26.6–33.0)
MCHC: 34.2 g/dL (ref 31.5–35.7)
MCV: 97 fL (ref 79–97)
Monocytes Absolute: 1.1 10*3/uL — ABNORMAL HIGH (ref 0.1–0.9)
Monocytes: 15 %
NEUTROS PCT: 53 %
Neutrophils Absolute: 3.7 10*3/uL (ref 1.4–7.0)
PLATELETS: 371 10*3/uL (ref 150–450)
RBC: 4.51 x10E6/uL (ref 4.14–5.80)
RDW: 14.8 % (ref 12.3–15.4)
WBC: 6.9 10*3/uL (ref 3.4–10.8)

## 2018-05-01 LAB — BASIC METABOLIC PANEL
BUN/Creatinine Ratio: 12 (ref 10–24)
BUN: 20 mg/dL (ref 8–27)
CALCIUM: 9.6 mg/dL (ref 8.6–10.2)
CHLORIDE: 105 mmol/L (ref 96–106)
CO2: 18 mmol/L — AB (ref 20–29)
Creatinine, Ser: 1.7 mg/dL — ABNORMAL HIGH (ref 0.76–1.27)
GFR calc non Af Amer: 39 mL/min/{1.73_m2} — ABNORMAL LOW (ref >59)
GFR, EST AFRICAN AMERICAN: 46 mL/min/{1.73_m2} — AB (ref >59)
Glucose: 103 mg/dL — ABNORMAL HIGH (ref 65–99)
POTASSIUM: 3.6 mmol/L (ref 3.5–5.2)
Sodium: 141 mmol/L (ref 134–144)

## 2018-05-11 ENCOUNTER — Telehealth: Payer: Self-pay | Admitting: *Deleted

## 2018-05-11 NOTE — Telephone Encounter (Addendum)
Pt contacted pre-ASD closure scheduled [facility] for: Thursday May 13, 2018 9:30 AM Ver[facility] at: 7:30 AM  No solid food after midnight prior to cath, clear liquids until 5 AM day of procedure. Verified allergies in Epic Verified no diabetes medications.  Hold: Xarelto-day before and day of procedure. Ramipril -hold until post procedure  Except hold medications AM meds can be  taken pre-cath with sip of water including: ASA 81 mg  Confirmed patient has responsible person to drive home post procedure and observe patient for 24 hours: yes  Discussed with patient Dr Earmon Phoenixooper's recommendation to drink plenty of fluids between now and procedure.   Per Dr Larence Penningooper-pt does not need to go in early for pre-procedure hydration-ASD is "no contrast" procedure, repeat BMP when he comes in for ASD closure.

## 2018-05-13 ENCOUNTER — Ambulatory Visit (HOSPITAL_BASED_OUTPATIENT_CLINIC_OR_DEPARTMENT_OTHER): Payer: Medicare Other

## 2018-05-13 ENCOUNTER — Ambulatory Visit (HOSPITAL_COMMUNITY)
Admission: RE | Admit: 2018-05-13 | Discharge: 2018-05-13 | Disposition: A | Discharge status: Home / Self Care | Payer: Medicare Other | Source: Ambulatory Visit | Attending: Cardiovascular Disease | Admitting: Cardiovascular Disease

## 2018-05-13 ENCOUNTER — Encounter (HOSPITAL_COMMUNITY)
Admission: RE | Disposition: A | Discharge status: Home / Self Care | Payer: Self-pay | Source: Ambulatory Visit | Attending: Cardiovascular Disease

## 2018-05-13 DIAGNOSIS — Z7901 Long term (current) use of anticoagulants: Secondary | ICD-10-CM | POA: Diagnosis not present

## 2018-05-13 DIAGNOSIS — I10 Essential (primary) hypertension: Secondary | ICD-10-CM | POA: Diagnosis not present

## 2018-05-13 DIAGNOSIS — Q211 Atrial septal defect, unspecified: Secondary | ICD-10-CM

## 2018-05-13 DIAGNOSIS — Z8673 Personal history of transient ischemic attack (TIA), and cerebral infarction without residual deficits: Secondary | ICD-10-CM | POA: Diagnosis not present

## 2018-05-13 DIAGNOSIS — Z7982 Long term (current) use of aspirin: Secondary | ICD-10-CM | POA: Insufficient documentation

## 2018-05-13 DIAGNOSIS — Z8249 Family history of ischemic heart disease and other diseases of the circulatory system: Secondary | ICD-10-CM | POA: Insufficient documentation

## 2018-05-13 DIAGNOSIS — Z86718 Personal history of other venous thrombosis and embolism: Secondary | ICD-10-CM | POA: Insufficient documentation

## 2018-05-13 HISTORY — PX: ATRIAL SEPTAL DEFECT(ASD) CLOSURE: CATH118299

## 2018-05-13 LAB — BASIC METABOLIC PANEL
Anion gap: 7 (ref 5–15)
BUN: 21 mg/dL — ABNORMAL HIGH (ref 6–20)
CALCIUM: 9.5 mg/dL (ref 8.9–10.3)
CO2: 24 mmol/L (ref 22–32)
Chloride: 108 mmol/L (ref 101–111)
Creatinine, Ser: 1.22 mg/dL (ref 0.61–1.24)
GFR calc Af Amer: >60 mL/min (ref >60)
GFR, EST NON AFRICAN AMERICAN: 57 mL/min — AB (ref >60)
GLUCOSE: 102 mg/dL — AB (ref 65–99)
POTASSIUM: 3.7 mmol/L (ref 3.5–5.1)
SODIUM: 139 mmol/L (ref 135–145)

## 2018-05-13 LAB — POCT ACTIVATED CLOTTING TIME
ACTIVATED CLOTTING TIME: 252 s
Activated Clotting Time: 208 seconds
Activated Clotting Time: 208 seconds
Activated Clotting Time: 191 seconds
Activated Clotting Time: 191 seconds

## 2018-05-13 LAB — ECHOCARDIOGRAM LIMITED
HEIGHTINCHES: 69 in
Weight: 2912 oz

## 2018-05-13 SURGERY — ATRIAL SEPTAL DEFECT (ASD) CLOSURE
Anesthesia: LOCAL

## 2018-05-13 MED ORDER — SODIUM CHLORIDE 0.9 % WEIGHT BASED INFUSION
3.0000 mL/kg/h | INTRAVENOUS | Status: AC
  Administered 2018-05-13: 3 mL/kg/h via INTRAVENOUS

## 2018-05-13 MED ORDER — SODIUM CHLORIDE 0.9% FLUSH: 3.0000 mL | Freq: Two times a day (BID) | INTRAVENOUS | Status: DC

## 2018-05-13 MED ORDER — SODIUM CHLORIDE 0.9 % IV SOLN: 250.0000 mL | INTRAVENOUS | Status: DC | PRN

## 2018-05-13 MED ORDER — CEFAZOLIN SODIUM-DEXTROSE 2-3 GM-%(50ML) IV SOLR
INTRAVENOUS | Status: DC | PRN
  Administered 2018-05-13: 2 g via INTRAVENOUS

## 2018-05-13 MED ORDER — CEFAZOLIN SODIUM-DEXTROSE 2-4 GM/100ML-% IV SOLN
INTRAVENOUS | Status: AC
  Filled 2018-05-13: qty 100

## 2018-05-13 MED ORDER — SODIUM CHLORIDE 0.9 % WEIGHT BASED INFUSION: 1.0000 mL/kg/h | INTRAVENOUS | Status: DC

## 2018-05-13 MED ORDER — LIDOCAINE HCL (PF) 1 % IJ SOLN
INTRAMUSCULAR | Status: AC
  Filled 2018-05-13: qty 30

## 2018-05-13 MED ORDER — HEPARIN SODIUM (PORCINE) 1000 UNIT/ML IJ SOLN
INTRAMUSCULAR | Status: DC | PRN
  Administered 2018-05-13: 4000 [IU] via INTRAVENOUS
  Administered 2018-05-13: 6000 [IU] via INTRAVENOUS

## 2018-05-13 MED ORDER — SODIUM CHLORIDE 0.9% FLUSH: 3.0000 mL | INTRAVENOUS | Status: DC | PRN

## 2018-05-13 MED ORDER — FENTANYL CITRATE (PF) 100 MCG/2ML IJ SOLN
INTRAMUSCULAR | Status: AC
  Filled 2018-05-13: qty 2

## 2018-05-13 MED ORDER — ACETAMINOPHEN 325 MG PO TABS: 650.0000 mg | ORAL_TABLET | ORAL | Status: DC | PRN

## 2018-05-13 MED ORDER — HEPARIN (PORCINE) IN NACL 2-0.9 UNITS/ML
INTRAMUSCULAR | Status: AC | PRN
  Administered 2018-05-13: 1000 mL

## 2018-05-13 MED ORDER — MIDAZOLAM HCL 2 MG/2ML IJ SOLN
INTRAMUSCULAR | Status: AC
  Filled 2018-05-13: qty 2

## 2018-05-13 MED ORDER — RAMIPRIL 10 MG PO CAPS
10.0000 mg | ORAL_CAPSULE | Freq: Every day | ORAL | Status: DC
  Administered 2018-05-13: 10 mg via ORAL
  Filled 2018-05-13: qty 1

## 2018-05-13 MED ORDER — HEPARIN (PORCINE) IN NACL 1000-0.9 UT/500ML-% IV SOLN
INTRAVENOUS | Status: AC
  Filled 2018-05-13: qty 1000

## 2018-05-13 MED ORDER — CEFAZOLIN SODIUM-DEXTROSE 2-4 GM/100ML-% IV SOLN: 2.0000 g | INTRAVENOUS | Status: DC

## 2018-05-13 MED ORDER — ASPIRIN 81 MG PO CHEW: 81.0000 mg | CHEWABLE_TABLET | ORAL | Status: DC

## 2018-05-13 MED ORDER — ONDANSETRON HCL 4 MG/2ML IJ SOLN: 4.0000 mg | Freq: Four times a day (QID) | INTRAMUSCULAR | Status: DC | PRN

## 2018-05-13 MED ORDER — LIDOCAINE HCL (PF) 1 % IJ SOLN
INTRAMUSCULAR | Status: DC | PRN
  Administered 2018-05-13: 20 mL

## 2018-05-13 MED ORDER — HEPARIN SODIUM (PORCINE) 1000 UNIT/ML IJ SOLN
INTRAMUSCULAR | Status: AC
  Filled 2018-05-13: qty 1

## 2018-05-13 MED ORDER — MIDAZOLAM HCL 2 MG/2ML IJ SOLN
INTRAMUSCULAR | Status: DC | PRN
  Administered 2018-05-13: 1 mg via INTRAVENOUS
  Administered 2018-05-13: 2 mg via INTRAVENOUS

## 2018-05-13 MED ORDER — FENTANYL CITRATE (PF) 100 MCG/2ML IJ SOLN
INTRAMUSCULAR | Status: DC | PRN
  Administered 2018-05-13 (×2): 25 ug via INTRAVENOUS

## 2018-05-13 SURGICAL SUPPLY — 18 items
BALLN SIZING AMPLATZER 24 (BALLOONS) ×2
BALLOON SIZING AMPLATZER 24 (BALLOONS) ×1 IMPLANT
CATH ACUNAV 8FR 90CM (CATHETERS) ×2 IMPLANT
CATH EXPO 5F MPA-1 (CATHETERS) ×2 IMPLANT
CATH INFINITI 5FR MPB2 (CATHETERS) ×2 IMPLANT
COVER PRB 48X5XTLSCP FOLD TPE (BAG) ×1 IMPLANT
COVER PROBE 5X48 (BAG) ×1
COVER SWIFTLINK CONNECTOR (BAG) ×2 IMPLANT
GUIDEWIRE AMPLATZER 1.5JX260 (WIRE) ×2 IMPLANT
GUIDEWIRE ANGLED .035X150CM (WIRE) ×2 IMPLANT
OCCLUDER AMPLATZER SEPTAL 12MM (Prosthesis & Implant Heart) ×2 IMPLANT
PACK CARDIAC CATHETERIZATION (CUSTOM PROCEDURE TRAY) ×2 IMPLANT
PROTECTION STATION PRESSURIZED (MISCELLANEOUS) ×2
SHEATH INTROD W/O MIN 9FR 25CM (SHEATH) ×2 IMPLANT
SHEATH PINNACLE 8F 10CM (SHEATH) ×2 IMPLANT
STATION PROTECTION PRESSURIZED (MISCELLANEOUS) ×1 IMPLANT
SYSTEM DELIVERY AMPLATZER 8FR (SHEATH) ×2 IMPLANT
WIRE EMERALD 3MM-J .035X150CM (WIRE) ×2 IMPLANT

## 2018-05-13 NOTE — Progress Notes (Signed)
Report and Care Re[facility] from [facility]. Patient resting at this time and states no pain. VSS. Rt groin soft with no active bleeding or hematoma noted. Will continue to monitor patient.

## 2018-05-13 NOTE — Interval H&P Note (Signed)
History and Physical Interval Note:  05/13/2018 9:29 AM  Macie BurowsJames Y Culbreth  has presented today for surgery, with the diagnosis of ASD  The various methods of treatment have been discussed with the patient and family. After consideration of risks, benefits and other options for treatment, the patient has consented to  Procedure(s): ATRIAL SEPTAL DEFECT (ASD) CLOSURE (N/A) as a surgical intervention .  The patient's history has been reviewed, patient examined, no change in status, stable for surgery.  I have reviewed the patient's chart and labs.  Questions were answered to the patient's satisfaction.     Tonny BollmanMichael Vyron Fronczak

## 2018-05-13 NOTE — Progress Notes (Signed)
  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  Leta JunglingCooper, Alexcis Bicking M 05/13/2018, 1:38 PM

## 2018-05-13 NOTE — Progress Notes (Signed)
Site area: rt groin fv sheaths x2 Site Prior to Removal:  Level 0 Pressure Applied For: 20 minutes Manual:   yes Patient Status During Pull:  stable Post Pull Site:  Level 0 Post Pull Instructions Given:  yes Post Pull Pulses Present:  Rt pt palpable Dressing Applied:  Gauze and tegaderm Bedrest begins @ 1245 Comments:

## 2018-05-13 NOTE — Discharge Instructions (Signed)
Angiogram, Care After °This sheet gives you information about how to care for yourself after your procedure. Your health care provider may also give you more specific instructions. If you have problems or questions, contact your health care provider. °What can I expect after the procedure? °After the procedure, it is common to have bruising and tenderness at the catheter insertion area. °Follow these instructions at home: °Insertion site care °· Follow instructions from your health care provider about how to take care of your insertion site. Make sure you: °? Wash your hands with soap and water before you change your bandage (dressing). If soap and water are not available, use hand sanitizer. °? Change your dressing as told by your health care provider. °? Leave stitches (sutures), skin glue, or adhesive strips in place. These skin closures may need to stay in place for 2 weeks or longer. If adhesive strip edges start to loosen and curl up, you may trim the loose edges. Do not remove adhesive strips completely unless your health care provider tells you to do that. °· Do not take baths, swim, or use a hot tub until your health care provider approves. °· You may shower 24-48 hours after the procedure or as told by your health care provider. °? Gently wash the site with plain soap and water. °? Pat the area dry with a clean towel. °? Do not rub the site. This may cause bleeding. °· Do not apply powder or lotion to the site. Keep the site clean and dry. °· Check your insertion site every day for signs of infection. Check for: °? Redness, swelling, or pain. °? Fluid or blood. °? Warmth. °? Pus or a bad smell. °Activity °· Rest as told by your health care provider, usually for 1-2 days. °· Do not lift anything that is heavier than 10 lbs. (4.5 kg) or as told by your health care provider. °· Do not drive for 24 hours if you were given a medicine to help you relax (sedative). °· Do not drive or use heavy machinery while  taking prescription pain medicine. °General instructions °· Return to your normal activities as told by your health care provider, usually in about a week. Ask your health care provider what activities are safe for you. °· If the catheter site starts bleeding, lie flat and put pressure on the site. If the bleeding does not stop, get help right away. This is a medical emergency. °· Drink enough fluid to keep your urine clear or pale yellow. This helps flush the contrast dye from your body. °· Take over-the-counter and prescription medicines only as told by your health care provider. °· Keep all follow-up visits as told by your health care provider. This is important. °Contact a health care provider if: °· You have a fever or chills. °· You have redness, swelling, or pain around your insertion site. °· You have fluid or blood coming from your insertion site. °· The insertion site feels warm to the touch. °· You have pus or a bad smell coming from your insertion site. °· You have bruising around the insertion site. °· You notice blood collecting in the tissue around the catheter site (hematoma). The hematoma may be painful to the touch. °Get help right away if: °· You have severe pain at the catheter insertion area. °· The catheter insertion area swells very fast. °· The catheter insertion area is bleeding, and the bleeding does not stop when you hold steady pressure on the area. °·   The area near or just beyond the catheter insertion site becomes pale, cool, tingly, or numb. These symptoms may represent a serious problem that is an emergency. Do not wait to see if the symptoms will go away. Get medical help right away. Call your local emergency services (911 in the [place] Do not drive yourself to the hospital. Summary  After the procedure, it is common to have bruising and tenderness at the catheter insertion area.  After the procedure, it is important to rest and drink plenty of fluids.  Do not take baths,  swim, or use a hot tub until your health care provider says it is okay to do so. You may shower 24-48 hours after the procedure or as told by your health care provider.  If the catheter site starts bleeding, lie flat and put pressure on the site. If the bleeding does not stop, get help right away. This is a medical emergency. This information is not intended to replace advice given to you by your health care provider. Make sure you discuss any questions you have with your health care provider. Document Released: 05/29/2005 Document Revised: 10/15/2016 Document Reviewed: 10/15/2016 Elsevier Interactive Patient Education  2018 [employer] Groin Site Care Refer to this sheet in the next few weeks. These instructions provide you with information on caring for yourself after your procedure. Your caregiver may also give you more specific instructions. Your treatment has been planned according to current medical practices, but problems sometimes occur. Call your caregiver if you have any problems or questions after your procedure. HOME CARE INSTRUCTIONS  You may shower 24 hours after the procedure. Remove the bandage (dressing) and gently wash the site with plain soap and water. Gently pat the site dry.   Do not apply powder or lotion to the site.   Do not sit in a bathtub, swimming pool, or whirlpool for 5 to 7 days.   No bending, squatting, or lifting anything over 10 pounds (4.5 kg) as directed by your caregiver.   Inspect the site at least twice daily.   Do not drive home if you are discharged the same day of the procedure. Have someone else drive you.   You may drive 24 hours after the procedure unless otherwise instructed by your caregiver.  What to expect:  Any bruising will usually fade within 1 to 2 weeks.   Blood that collects in the tissue (hematoma) may be painful to the touch. It should usually decrease in size and tenderness within 1 to 2 weeks.  SEEK IMMEDIATE MEDICAL CARE  IF:  You have unusual pain at the groin site or down the affected leg.   You have redness, warmth, swelling, or pain at the groin site.   You have drainage (other than a small amount of blood on the dressing).   You have chills.   You have a fever or persistent symptoms for more than 72 hours.   You have a fever and your symptoms suddenly get worse.   Your leg becomes pale, cool, tingly, or numb.  You have heavy bleeding from the site. Hold pressure on the site. .Marland Kitchen

## 2018-05-13 NOTE — Progress Notes (Addendum)
pts bp elevated 152/ 98 x 1 hr, hr 45-47. Lillia AbedLindsay PA cardiology was paged for update.  bp med order followed.  1655 spoke with Lillia AbedLindsay PA about bp/ meds, pt may be dc as ordered.

## 2018-05-14 ENCOUNTER — Encounter (HOSPITAL_COMMUNITY): Payer: Self-pay | Admitting: Cardiovascular Disease

## 2018-06-08 NOTE — Progress Notes (Signed)
[facility]   MULTIDISCIPLINARY HEART VALVE CLINIC                                       Cardiology Office Note    Date:  06/10/2018   ID:  Marcus Wise, DOB 07-16-1946, MRN [id number]  PCP:  Marlyn Corporal, PA  Cardiologist: Tonny Bollman, MD    CC: 1 month s/p ASD  History of Present Illness:  Marcus Wise is a 72 y.o. 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 He presented with left arm numbness and left leg clumsiness and was diagnosed with a stroke in 01/2018. 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 He saw Dr. Excell Seltzer for evaluation and underwent successful ASD closure with a 12mm Amplatzer septal occluder device on 05/13/18. Follow up echo showed a well seated device with no flow, EF 45-50%.  Today he presents to clinic for follow up.  He can really tell a difference since his surgery. He feels like he has better exercise since having his ASD closure. He is very active and exercises everyday. No CP or SOB. No LE edema, orthopnea or PND. No dizziness or syncope. No blood in stool or urine. No palpitations. No new neurologic symptoms. He is very grateful for the care he got at [facility].     Past Medical History:  Diagnosis Date  . ASD (atrial septal defect)   . 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 Hypertension     Past Surgical History:  Procedure Laterality Date  . ATRIAL SEPTAL DEFECT(ASD) CLOSURE N/A 05/13/2018   Procedure: ATRIAL SEPTAL DEFECT (ASD) CLOSURE;  Surgeon: Tonny Bollman, MD;  Location: [facility] INVASIVE CV  LAB;  Service: Cardiovascular;  Laterality: N/A;  . BACK SURGERY    . KNEE SURGERY    . SPLENECTOMY      Current Medications: Outpatient Medications Prior to Visit  Medication Sig Dispense Refill  . aspirin EC 81 MG tablet Take 1 tablet (81 mg total) by mouth daily. 90 tablet 3  . Coenzyme Q10 (CO Q-10) 200 MG CAPS Take 200 mg by mouth daily.    . Misc Natural Products (CORNSILK PO) Take 1 tablet by mouth daily.    . ramipril (ALTACE) 10 MG capsule Take 10 mg by mouth daily.     . rivaroxaban (XARELTO) 20 MG TABS tablet Take 1 tablet (20 mg total) by mouth daily with supper. 90 tablet 1  . pravastatin (PRAVACHOL) 40 MG tablet Take 40 mg by mouth at bedtime.     No facility-administered medications prior to visit.      Allergies:   Patient has no known allergies.   Social History   Socioeconomic History  . Marital status: Married    Spouse name: Not on file  . Number of children: Not on file  . Years of education: Not on file  . Highest education level:  Not on file  Occupational History  . Not on file  Social Needs  . Financial resource strain: Not on file  . Food insecurity:    Worry: Not on file    Inability: Not on file  . Transportation needs:    Medical: Not on file    Non-medical: Not on file  Tobacco Use  . Smoking status: Never Smoker  . Smokeless tobacco: Never Used  Substance and Sexual Activity  . Alcohol use: Not on file  . Drug use: Not on file  . Sexual activity: Not on file  Lifestyle  . Physical activity:    Days per week: Not on file    Minutes per session: Not on file  . Stress: Not on file  Relationships  . Social connections:    Talks on phone: Not on file    Gets together: Not on file    Attends religious service: Not on file    Active member of club or organization: Not on file    Attends meetings of clubs or organizations: Not on file    Relationship status: Not on file  Other Topics Concern  . Not on file  Social History Narrative    . Not on file     Family History:  The patient's family history includes Heart attack in his father.      ROS:   Please see the history of present illness.    ROS All other systems reviewed and are negative.   PHYSICAL EXAM:   VS:  BP 136/88   Pulse 62   Ht 5\' 9"  (1.753 m)   Wt 182 lb (82.6 kg)   SpO2 98%   BMI 26.88 kg/m    GEN: Well nourished, well developed, in no acute distress. Appears younger than stated age.  HEENT: normal  Neck: no JVD, carotid bruits, or masses Cardiac: RRR; no murmurs, rubs, or gallops,no edema  Respiratory:  clear to auscultation bilaterally, normal work of breathing GI: soft, nontender, nondistended, + BS MS: no deformity or atrophy  Skin: warm and dry, no rash Neuro:  Alert and Oriented x 3, Strength and sensation are intact Psych: euthymic mood, full affect    Wt Readings from Last 3 Encounters:  06/10/18 182 lb (82.6 kg)  05/13/18 182 lb (82.6 kg)  04/30/18 186 lb (84.4 kg)      Studies/Labs Reviewed:   EKG:  EKG is NOT ordered today.    Recent Labs: 04/30/2018: Hemoglobin 15.0; Platelets 371 05/13/2018: BUN 21; Creatinine, Ser 1.22; Potassium 3.7; Sodium 139   Lipid Panel No results found for: CHOL, TRIG, HDL, CHOLHDL, VLDL, LDLCALC, LDLDIRECT  Additional studies/ records that were reviewed today include:   05/13/18 ATRIAL SEPTAL DEFECT (ASD) CLOSURE  Conclusion   Successful transcatheter ASD closure using a 12 mm Amplatzer Septal Occluder device under fluoroscopic and intracardiac echo guidance   _______________   Limited echo 05/13/18 Study Conclusions - Left ventricle: The cavity size was normal. Wall thickness was   normal. Systolic function was mildly reduced. The estimated   ejection fraction was in the range of 45% to 50%. - Right ventricle: Poorly visualized. - Atrial septum: There was a atrial septal closure device present.   The device appeared well-seated with no visible flow across it. Impressions: -  Limited echo.   ASSESSMENT & PLAN:   ASD s/p closure: per Dr. Earmon Phoenix notes plan is to stop ASA after 3 months of therapy. He remains on Xarelto, but would like to  come off it eventually. Because he had a provoked DVT and CVA likely related to ASD, I do not think he requires lifelong anticoagulation. I have asked him to continue on ASA and Xaretlo for now. I will discuss his medications with Dr. Excell Seltzerooper and call him next week with the plan.  I will see him back in 1 year with a limited echo with bubble in 1 year (04/2019).  CVA: he could not tolerate the statin and has discontinued it. Continue on ASA 81 mg daily. He is not followed by neurology.   HTN: his BP is well controlled on no medications.   Medication Adjustments/Labs and Tests Ordered: Current medicines are reviewed at length with the patient today.  Concerns regarding medicines are outlined above.  Medication changes, Labs and Tests ordered today are listed in the Patient Instructions below. Patient Instructions  Medication Instructions:  Your physician recommends that you continue on your current medications as directed. Please refer to the Current Medication list given to you today.   Labwork: None Ordered   Testing/Procedures: Your physician has requested that you have an echocardiogram in 1 year. Echocardiography is a painless test that uses sound waves to create images of your heart. It provides your doctor with information about the size and shape of your heart and how well your heart's chambers and valves are working. This procedure takes approximately one hour. There are no restrictions for this procedure.   Follow-Up: Your physician wants you to follow-up in: 1 year with Carlean JewsKatie Makaleigh Reinard, PA. You will receive a reminder letter in the mail two months in advance. If you don't receive a letter, please call our office to schedule the follow-up appointment.   If you need a refill on your cardiac medications before your next  appointment, please call your pharmacy.   Thank you for choosing [facility]! Eligha BridegroomMichelle Swinyer, RN [phone]       Signed, Cline CrockKathryn Gerlene Glassburn, PA-C  06/10/2018 1:54 PM    [facility] [address], [place], [place]  [address] Phone: [phone]; Fax: [phone]

## 2018-06-10 ENCOUNTER — Ambulatory Visit: Payer: Medicare Other | Admitting: Physician Assistant

## 2018-06-10 ENCOUNTER — Encounter: Payer: Self-pay | Admitting: Physician Assistant

## 2018-06-10 VITALS — BP 136/88 | HR 62 | Ht 69.0 in | Wt 182.0 lb

## 2018-06-10 DIAGNOSIS — Q211 Atrial septal defect, unspecified: Secondary | ICD-10-CM

## 2018-06-10 DIAGNOSIS — I639 Cerebral infarction, unspecified: Secondary | ICD-10-CM | POA: Diagnosis not present

## 2018-06-10 DIAGNOSIS — I10 Essential (primary) hypertension: Secondary | ICD-10-CM

## 2018-06-10 DIAGNOSIS — E785 Hyperlipidemia, unspecified: Secondary | ICD-10-CM | POA: Diagnosis not present

## 2018-06-10 MED ORDER — RIVAROXABAN 20 MG PO TABS: 20.0000 mg | ORAL_TABLET | Freq: Every day | ORAL | 0 refills | Status: DC

## 2018-06-10 NOTE — Patient Instructions (Addendum)
Medication Instructions:  Your physician recommends that you continue on your current medications as directed. Please refer to the Current Medication list given to you today.   Labwork: None Ordered   Testing/Procedures: Your physician has requested that you have an echocardiogram in 1 year. Echocardiography is a painless test that uses sound waves to create images of your heart. It provides your doctor with information about the size and shape of your heart and how well your heart's chambers and valves are working. This procedure takes approximately one hour. There are no restrictions for this procedure.   Follow-Up: Your physician wants you to follow-up in: 1 year with Carlean JewsKatie Thompson, PA. You will receive a reminder letter in the mail two months in advance. If you don't receive a letter, please call our office to schedule the follow-up appointment.   If you need a refill on your cardiac medications before your next appointment, please call your pharmacy.   Thank you for choosing [facility]! Eligha BridegroomMichelle Aydden Cumpian, RN [phone]

## 2018-06-11 NOTE — Progress Notes (Signed)
I agree with that plan. 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 thx!

## 2018-06-14 ENCOUNTER — Other Ambulatory Visit: Payer: Self-pay | Admitting: Physician Assistant

## 2018-06-14 ENCOUNTER — Telehealth: Payer: Self-pay | Admitting: Physician Assistant

## 2018-06-14 MED ORDER — RIVAROXABAN 20 MG PO TABS: 20.0000 mg | ORAL_TABLET | Freq: Every day | ORAL | 1 refills | Status: DC

## 2018-06-14 NOTE — Telephone Encounter (Signed)
  [facility]   MULTIDISCIPLINARY HEART VALVE TEAM  Discussed case with Dr. Excell Seltzerooper. Plan is to stop Xarelto 20mg  after 3 months s/p ASD closure and then continue on ASA 81 mg daily long term. I have called the patient and he verbalized understanding. Xarelto 20mg  #30 with one refill was sent into [facility].   Cline CrockKathryn Judea Fennimore PA-C  [facility]

## 2018-09-20 ENCOUNTER — Telehealth: Payer: Self-pay | Admitting: Physician Assistant

## 2018-09-20 NOTE — Telephone Encounter (Signed)
New Message   Pt returning call for nurse about labs, states he will be in a doc appt until 3:45 and to please call after that.

## 2018-09-22 NOTE — Telephone Encounter (Signed)
Left message to call back  

## 2018-09-23 NOTE — Telephone Encounter (Signed)
Left message to call back  

## 2018-10-01 DIAGNOSIS — E785 Hyperlipidemia, unspecified: Secondary | ICD-10-CM

## 2018-10-01 NOTE — Telephone Encounter (Signed)
Left message for patient that MyChart message will be sent with Dr. Earmon Phoenix recommendations and requested the patient respond that he understands.

## 2018-11-01 ENCOUNTER — Other Ambulatory Visit: Payer: Medicare Other

## 2018-11-03 ENCOUNTER — Other Ambulatory Visit: Payer: Medicare Other | Admitting: *Deleted

## 2018-11-03 DIAGNOSIS — E785 Hyperlipidemia, unspecified: Secondary | ICD-10-CM

## 2018-11-04 LAB — HEPATIC FUNCTION PANEL
ALT: 53 IU/L — ABNORMAL HIGH (ref 0–44)
AST: 48 IU/L — ABNORMAL HIGH (ref 0–40)
Albumin: 4.6 g/dL (ref 3.5–4.8)
Alkaline Phosphatase: 73 IU/L (ref 39–117)
BILIRUBIN TOTAL: 0.4 mg/dL (ref 0.0–1.2)
Bilirubin, Direct: 0.12 mg/dL (ref 0.00–0.40)
Total Protein: 6.9 g/dL (ref 6.0–8.5)

## 2019-03-16 ENCOUNTER — Telehealth: Payer: Self-pay | Admitting: Cardiovascular Disease

## 2019-03-16 NOTE — Telephone Encounter (Signed)
Follow Up: ° ° ° °Returning your call from yesterday. °

## 2019-03-16 NOTE — Telephone Encounter (Signed)
Due to COVID 19 restrictions, rescheduled patient's 1 year ASD closure echo and office visit with Carlean Jews to 8/6. He was grateful for call and agrees with treatment plan.

## 2019-05-04 ENCOUNTER — Other Ambulatory Visit (HOSPITAL_COMMUNITY): Payer: Medicare Other

## 2019-05-04 ENCOUNTER — Ambulatory Visit: Payer: Medicare Other | Admitting: Physician Assistant

## 2019-06-07 ENCOUNTER — Other Ambulatory Visit (HOSPITAL_COMMUNITY): Payer: Medicare Other

## 2019-06-28 ENCOUNTER — Telehealth: Payer: Self-pay

## 2019-06-28 NOTE — Telephone Encounter (Signed)
YOUR CARDIOLOGY TEAM HAS ARRANGED FOR AN E-VISIT FOR YOUR APPOINTMENT - PLEASE REVIEW IMPORTANT INFORMATION BELOW SEVERAL DAYS PRIOR TO YOUR APPOINTMENT  Due to the recent COVID-19 pandemic, we are transitioning in-person office visits to tele-medicine visits in an effort to decrease unnecessary exposure to our patients, their families, and staff. These visits are billed to your insurance just like a normal visit is. We also encourage you to sign up for MyChart if you have not already done so. You will need a smartphone if possible. For patients that do not have this, we can still complete the visit using a regular telephone but do prefer a smartphone to enable video when possible. You may have a family member that lives with you that can help. If possible, we also ask that you have a blood pressure cuff and scale at home to measure your blood pressure, heart rate and weight prior to your scheduled appointment. Patients with clinical needs that need an in-person evaluation and testing will still be able to come to the office if absolutely necessary. If you have any questions, feel free to call our office.     YOUR PROVIDER WILL BE USING THE FOLLOWING PLATFORM TO COMPLETE YOUR VISIT: Doxy.Me  . IF USING MYCHART - How to Download the MyChart App to Your SmartPhone   - If Apple, go to App Store and type in MyChart in the search bar and download the app. If Android, ask patient to go to Google Play Store and type in MyChart in the search bar and download the app. The app is free but as with any other app downloads, your phone may require you to verify saved payment information or Apple/Android password.  - You will need to then log into the app with your MyChart username and password, and select [facility] as your healthcare provider to link the account.  - When it is time for your visit, go to the MyChart app, find appointments, and click Begin Video Visit. Be sure to Select Allow for your device to  access the Microphone and Camera for your visit. You will then be connected, and your provider will be with you shortly.  **If you have any issues connecting or need assistance, please contact MyChart service desk [phone] ([phone])**  **If using a computer, in order to ensure the best quality for your visit, you will need to use either of the following Internet Browsers: Google Chrome or Microsoft Edge**  . IF USING DOXIMITY or [email or web address].ME - The staff will give you instructions on receiving your link to join the meeting the day of your visit.      2-3 DAYS BEFORE YOUR APPOINTMENT  You will receive a telephone call from one of our [facility] team members - your caller ID may say "Unknown caller." If this is a video visit, we will walk you through how to get the video launched on your phone. We will remind you check your blood pressure, heart rate and weight prior to your scheduled appointment. If you have an Apple Watch or Kardia, please upload any pertinent ECG strips the day before or morning of your appointment to MyChart. Our staff will also make sure you have reviewed the consent and agree to move forward with your scheduled tele-health visit.     THE DAY OF YOUR APPOINTMENT  Approximately 15 minutes prior to your scheduled appointment, you will receive a telephone call from one of HeartCare team - your caller ID may say "Unknown caller."    Our staff will confirm medications, vital signs for the day and any symptoms you may be experiencing. Please have this information available prior to the time of visit start. It may also be helpful for you to have a pad of paper and pen handy for any instructions given during your visit. They will also walk you through joining the smartphone meeting if this is a video visit.    CONSENT FOR TELE-HEALTH VISIT - PLEASE REVIEW  I hereby voluntarily request, consent and authorize [facility] and its employed or contracted physicians, physician  assistants, nurse practitioners or other licensed health care professionals (the Practitioner), to provide me with telemedicine health care services (the "Services") as deemed necessary by the treating Practitioner. I acknowledge and consent to receive the Services by the Practitioner via telemedicine. I understand that the telemedicine visit will involve communicating with the Practitioner through live audiovisual communication technology and the disclosure of certain medical information by electronic transmission. I acknowledge that I have been given the opportunity to request an in-person assessment or other available alternative prior to the telemedicine visit and am voluntarily participating in the telemedicine visit.  I understand that I have the right to withhold or withdraw my consent to the use of telemedicine in the course of my care at any time, without affecting my right to future care or treatment, and that the Practitioner or I may terminate the telemedicine visit at any time. I understand that I have the right to inspect all information obtained and/or recorded in the course of the telemedicine visit and may receive copies of available information for a reasonable fee.  I understand that some of the potential risks of receiving the Services via telemedicine include:  . Delay or interruption in medical evaluation due to technological equipment failure or disruption; . Information transmitted may not be sufficient (e.g. poor resolution of images) to allow for appropriate medical decision making by the Practitioner; and/or  . In rare instances, security protocols could fail, causing a breach of personal health information.  Furthermore, I acknowledge that it is my responsibility to provide information about my medical history, conditions and care that is complete and accurate to the best of my ability. I acknowledge that Practitioner's advice, recommendations, and/or decision may be based on  factors not within their control, such as incomplete or inaccurate data provided by me or distortions of diagnostic images or specimens that may result from electronic transmissions. I understand that the practice of medicine is not an exact science and that Practitioner makes no warranties or guarantees regarding treatment outcomes. I acknowledge that I will receive a copy of this consent concurrently upon execution via email to the email address I last provided but may also request a printed copy by calling the office of [facility].    I understand that my insurance will be billed for this visit.   I have read or had this consent read to me. . I understand the contents of this consent, which adequately explains the benefits and risks of the Services being provided via telemedicine.  . I have been provided ample opportunity to ask questions regarding this consent and the Services and have had my questions answered to my satisfaction. . I give my informed consent for the services to be provided through the use of telemedicine in my medical care  By participating in this telemedicine visit I agree to the above.  

## 2019-06-29 ENCOUNTER — Telehealth: Payer: Self-pay

## 2019-06-29 NOTE — Telephone Encounter (Signed)
Spoke with pt and reviewed medications, and appt time and date. Pt is aware to have vitals ready prior to appt. Pt also states that he is no longer taking the Xarelto and [place].

## 2019-06-30 ENCOUNTER — Other Ambulatory Visit: Payer: Self-pay

## 2019-06-30 ENCOUNTER — Ambulatory Visit (HOSPITAL_COMMUNITY): Payer: Medicare Other | Attending: Cardiology

## 2019-06-30 ENCOUNTER — Telehealth (INDEPENDENT_AMBULATORY_CARE_PROVIDER_SITE_OTHER): Payer: Medicare Other | Admitting: Physician Assistant

## 2019-06-30 VITALS — Ht 69.0 in | Wt 184.0 lb

## 2019-06-30 DIAGNOSIS — Z7189 Other specified counseling: Secondary | ICD-10-CM

## 2019-06-30 DIAGNOSIS — Z8774 Personal history of (corrected) congenital malformations of heart and circulatory system: Secondary | ICD-10-CM

## 2019-06-30 DIAGNOSIS — Q211 Atrial septal defect, unspecified: Secondary | ICD-10-CM

## 2019-06-30 DIAGNOSIS — F329 Major depressive disorder, single episode, unspecified: Secondary | ICD-10-CM | POA: Diagnosis not present

## 2019-06-30 DIAGNOSIS — I639 Cerebral infarction, unspecified: Secondary | ICD-10-CM | POA: Diagnosis not present

## 2019-06-30 DIAGNOSIS — F32A Depression, unspecified: Secondary | ICD-10-CM

## 2019-06-30 DIAGNOSIS — I10 Essential (primary) hypertension: Secondary | ICD-10-CM | POA: Diagnosis not present

## 2019-06-30 NOTE — Patient Instructions (Signed)
Medication Instructions:  Your physician recommends that you continue on your current medications as directed. Please refer to the Current Medication list given to you today.  If you need a refill on your cardiac medications before your next appointment, please call your pharmacy.   Lab work: none If you have labs (blood work) drawn today and your tests are completely normal, you will receive your results only by: [facility] Kitchen MyChart Message (if you have MyChart) OR . A paper copy in the mail If you have any lab test that is abnormal or we need to change your treatment, we will call you to review the results.  Testing/Procedures: none  Follow-Up:  To be determined based on echocardiogram results.  We will call you to go over results when available.  Please purchase a blood pressure cuff and check blood pressure at home.  Keep a record of readings.  Please call us or send a my chart message with readings in about 10-14 days

## 2019-06-30 NOTE — Progress Notes (Signed)
[facility]   MULTIDISCIPLINARY HEART VALVE TEAM   Virtual Visit via Telephone Note   This visit type was conducted due to national recommendations for restrictions regarding the COVID-19 Pandemic (e.g. social distancing) in an effort to limit this patient's exposure and mitigate transmission in our community.  Due to his co-morbid illnesses, this patient is at least at moderate risk for complications without adequate follow up.  This format is felt to be most appropriate for this patient at this time.  The patient did not have access to video technology/had technical difficulties with video requiring transitioning to audio format only (telephone).  All issues noted in this document were discussed and addressed.  No physical exam could be performed with this format.  Please refer to the patient's chart for his  consent to telehealth for Marcus Wise.   Evaluation Performed:  Follow-up visit  Date:  07/01/2019   ID:  Marcus Wise, DOB [birth year], MRN [id number]  Patient Location: Home Provider Location: Office  PCP:  [name], [name], PA  Cardiologist: Dr. Excell Seltzerooper   Chief Complaint:  1 year s/p ASD closure   History of Present Illness:    Marcus Wise is a 73 y.o. 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 He presented with left arm numbness and left leg clumsiness and was diagnosed with a stroke in 01/2018. 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 He saw Dr. Excell Seltzerooper for evaluation and underwent successful ASD closure with a 12mm Amplatzer septal occluder device on 05/13/18.  Follow up echo showed a well seated device with no flow, EF 45-50%. At 1 month follow up we decided to discontinue his Xarelto after 3 months of therapy and continue on a baby aspirin indefinitely.   The patient does not have symptoms concerning for COVID-19 infection (fever, chills, cough, or new shortness of breath).   Today he presents via telephone for follow up. He has been exercising as much as he can with the swimming pool being closed. He has bought a treadmill and he jogs at least 1/2 a mile everyday. Trying to get back to his interval training. Not eating as well as he should and drinking a bit more than he should. Having issues with trying to navigate the pandemic. No CP or SOB. No LE edema, orthopnea or PND. No dizziness or syncope. No blood in stool or urine. No palpitations.    Past Medical History:  Diagnosis Date  . ASD (atrial septal defect)   . 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 Hypertension    Past Surgical History:  Procedure Laterality Date  . ATRIAL SEPTAL DEFECT(ASD) CLOSURE N/A 05/13/2018   Procedure: ATRIAL SEPTAL DEFECT (ASD) CLOSURE;  Surgeon: Tonny Bollmanooper, Michael, MD;  [facility];  Service: Cardiovascular;  Laterality: N/A;  . BACK SURGERY    . KNEE SURGERY    . SPLENECTOMY       Current Meds  Medication Sig  . aspirin EC 81 MG tablet Take 1 tablet (81 mg total)  by mouth daily.  . Coenzyme Q10 (CO Q-10) 200 MG CAPS Take 200 mg by mouth daily.  . ramipril (ALTACE) 10 MG capsule Take 10 mg by mouth daily.      Allergies:   Patient has no known allergies.   Social History   Tobacco Use  . Smoking status: Never Smoker  . Smokeless tobacco: Never Used  Substance Use Topics  . Alcohol use: Not on file  . Drug use: Not on file     Family Hx: The patient's family history includes Heart attack in his father.  ROS:   Please see the history of present illness.    All other systems reviewed and are negative.    Prior CV studies:   The following studies were reviewed today:  05/13/18 ATRIAL SEPTAL DEFECT (ASD) CLOSURE  Conclusion   Successful transcatheter ASD closure using a 12 mm Amplatzer Septal Occluder device under fluoroscopic and intracardiac echo guidance   _______________   Limited echo 05/13/18 Study Conclusions - Left ventricle: The cavity size was normal. Wall thickness was normal. Systolic function was mildly reduced. The estimated ejection fraction was in the range of 45% to 50%. - Right ventricle: Poorly visualized. - Atrial septum: There was a atrial septal closure device present. The device appeared well-seated with no visible flow across it. Impressions: - Limited echo.  _______________  Echo w/ bubble 06/30/19 IMPRESSIONS  1. The right ventricle has normal systolc function. The cavity was normal. There is no increase in right ventricular wall thickness.  2. There is an Amplatzer atrial septal closure device in place. No evidence by doppler for residual shunting. Bubble study was negative.  3. No evidence of mitral valve stenosis. No significant mitral regurgitation.  4. The aortic valve is tricuspid No stenosis of the aortic valve.  5. The left ventricle had a visually estimated ejection fraction of of 50%. Left ventricular diastolic Doppler parameters are consistent with indeterminate diastolic dysfunction. Left ventricular diffuse hypokinesis.  6. The IVC was normal in size. No complete TR doppler jet so unable to estimate PA systolic pressure.   Labs/Other Tests and Data Reviewed:    EKG:  No ECG reviewed.  Recent Labs: 11/03/2018: ALT 53   Recent Lipid Panel No results found for: CHOL, TRIG, HDL, CHOLHDL, LDLCALC, LDLDIRECT  Wt Readings from Last 3 Encounters:  06/30/19 184 lb (83.5 kg)  06/10/18 182 lb (82.6 kg)  05/13/18 182 lb (82.6 kg)     Objective:    Vital Signs:  Ht 5\' 9"  (1.753 m)   Wt 184 lb (83.5 kg)   BMI 27.17 kg/m     ASSESSMENT & PLAN:    ASD s/p closure: doing well. Echo today shows EF 50%, normally functioning ASD closure with negative bubble. No new neurologic symptoms. Continue on a baby aspirin indefinitely.  HTN: he is a little upset because his BP was elevated to 160/100 mm Hg during echo today. He has a cuff at home that is not reliable. He is on ramipril 10mg  daily. I have asked him to buy a new BP cuff and keep a log for me and call me back or MyChart message me with a log. He agreed to this.   Depression/anxiety: having trouble navigating pandemic, feels isolated. Has been eating and drinking more than he should. I discussed referral to psychology if necessary.   COVID-19 Education: The signs and symptoms of COVID-19 were discussed with the patient and how to seek care for testing (follow up with  PCP or arrange E-visit).  The importance of social distancing was discussed today.  Time:   Today, I have spent 20 minutes with the patient with telehealth technology discussing the above problems.     Medication Adjustments/Labs and Tests Ordered: Current medicines are reviewed at length with the patient today.  Concerns regarding medicines are outlined above.   Tests Ordered: No orders of the defined types were placed in this encounter.   Medication Changes: No orders of the defined types were placed in this encounter.   Disposition:  Follow up prn   Signed, Cline CrockKathryn Debi Cousin, PA-C  07/01/2019 9:46 AM    [facility]

## 2019-11-08 DIAGNOSIS — R251 Tremor, unspecified: Secondary | ICD-10-CM | POA: Diagnosis not present

## 2019-11-08 DIAGNOSIS — I10 Essential (primary) hypertension: Secondary | ICD-10-CM

## 2019-11-09 DIAGNOSIS — R251 Tremor, unspecified: Secondary | ICD-10-CM | POA: Diagnosis not present

## 2019-11-09 DIAGNOSIS — I10 Essential (primary) hypertension: Secondary | ICD-10-CM | POA: Diagnosis not present

## 2020-02-20 IMAGING — CT CT HEART MORP W/ CTA COR W/ SCORE W/ CA W/CM &/OR W/O CM
4 of 7 series · 8 of 20 positions shown, 9 images · IV contrast (APPLIED)
Comparison: None.

CLINICAL DATA: Chest pain

EXAM:
Cardiac CTA
MEDICATIONS:
Sub lingual nitro. 4mg x 2
TECHNIQUE: The patient was scanned on a Siemens [REDACTED]ice scanner. Gantry
rotation speed was 250 msecs. Collimation was 0.6 mm. A 100 kV
prospective scan was triggered in the ascending thoracic aorta at
35-75% of the R-R interval. Average HR during the scan was 60 bpm.
The 3D data set was interpreted on a dedicated work station using
MPR, MIP and VRT modes. A total of 80cc of contrast was used.

[Series 6: best diast 74 % · axial · 0.38mm/px · z∈[-92,-43]mm · 2 of 368 slices shown, 3 images]
[im 123/368  vessel]
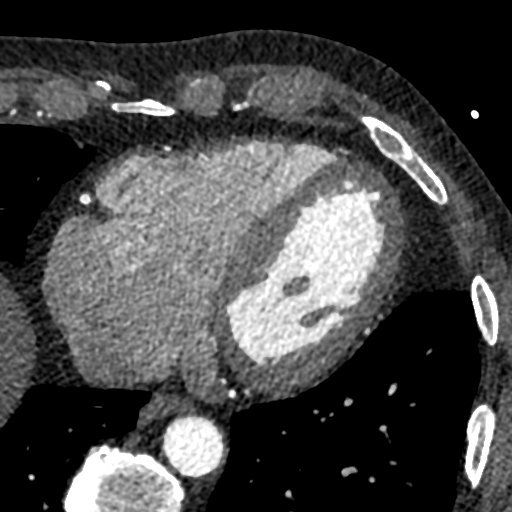
[im 123/368  lung]
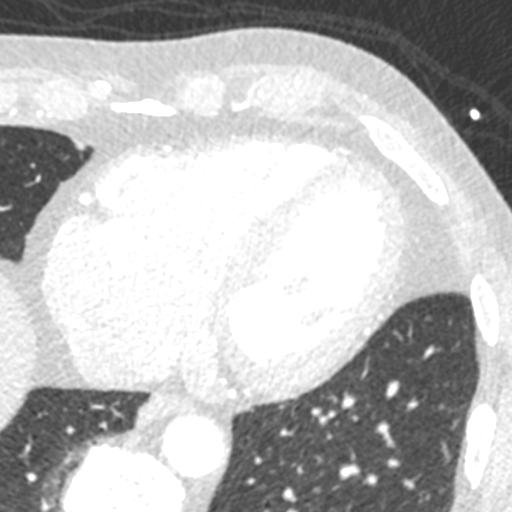
[im 245/368  vessel]
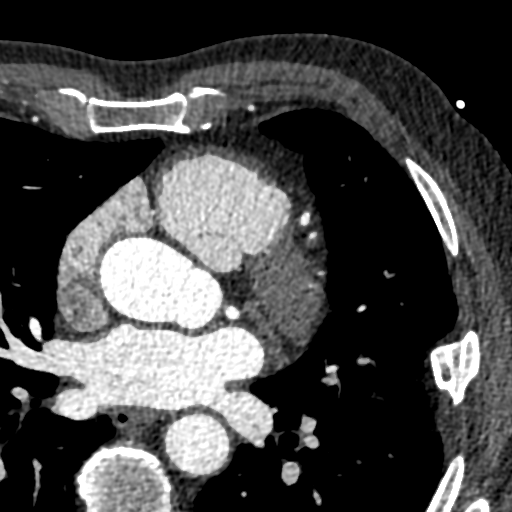

[Series 7: best syst 54 % · axial · 0.38mm/px · z∈[-92,-43]mm · 2 of 368 slices shown]
[im 123/368  vessel]
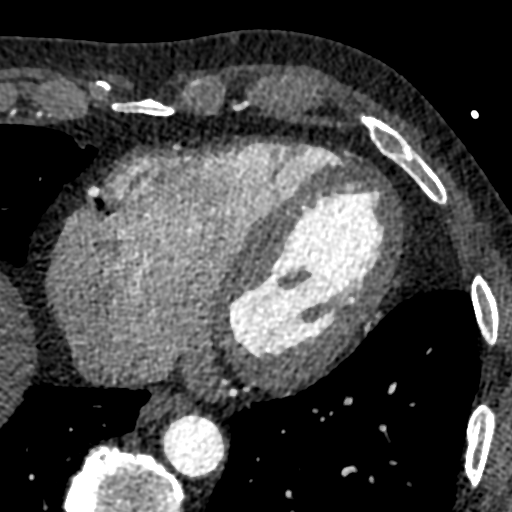
[im 245/368  vessel]
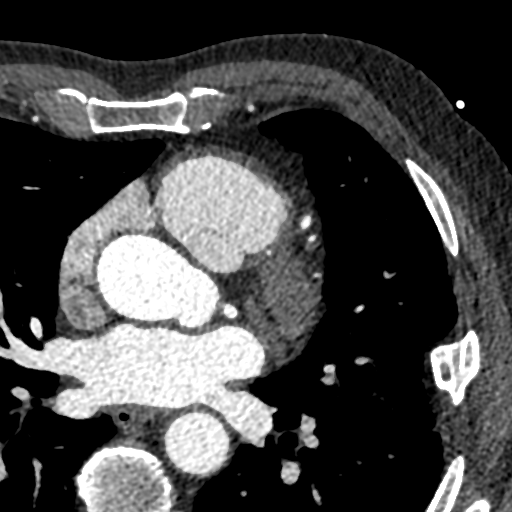

[Series 8: ts diast sharp 74 % · axial · 0.38mm/px · z∈[-92,-43]mm · 2 of 368 slices shown]
[im 123/368  lung]
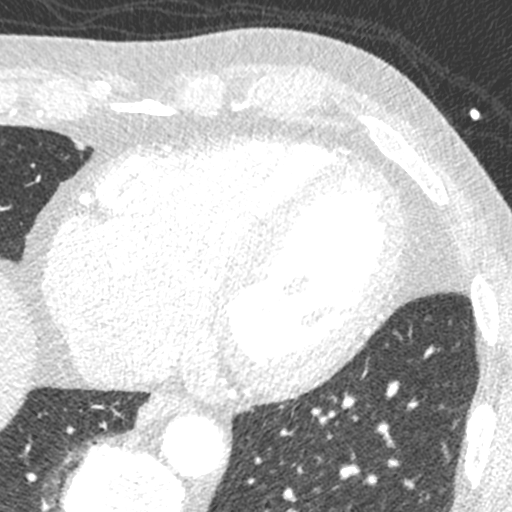
[im 245/368  lung]
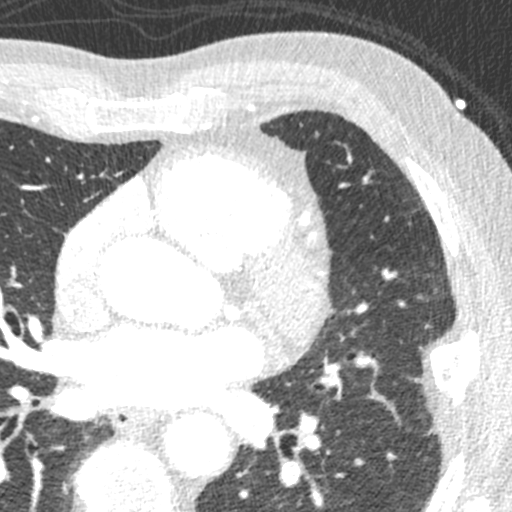

[Series 9: ts syst sharp 54 % · axial · 0.38mm/px · z∈[-92,-43]mm · 2 of 368 slices shown]
[im 123/368  lung]
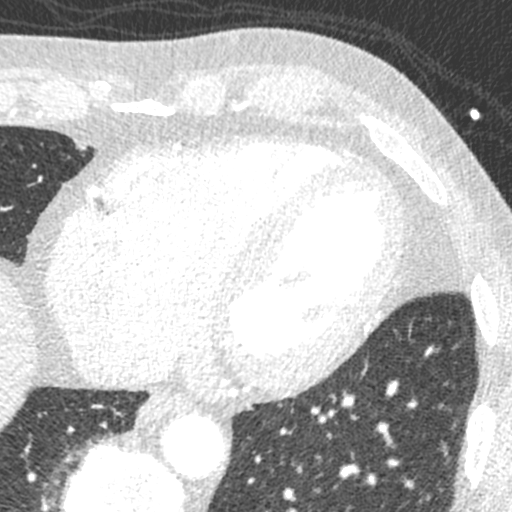
[im 245/368  lung]
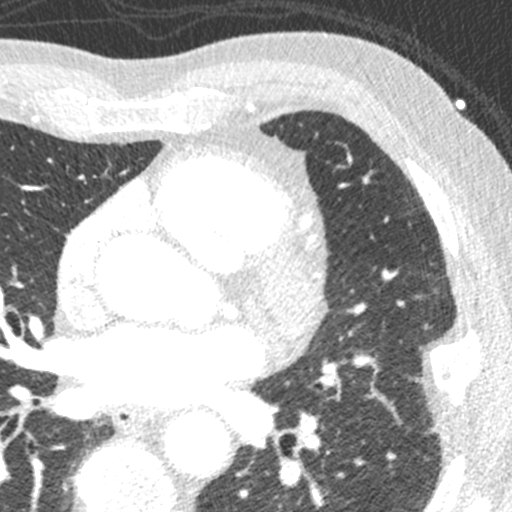

[8 of 20 positions shown; findings below may reference images not displayed]

FINDINGS: Non-cardiac: See separate report from [REDACTED].

There is a relatively small secundum ASD located in the mid-atrial
septum. It measures approximately 10 x 10 mm. The pulmonary veins
drain normally to the left atrium, no anomalous pulmonary venous
return.

Calcium Score: 0 Agatston units.

Coronary Arteries: Right dominant with no anomalies

LM: No plaque or stenosis.

LAD system: No plaque or stenosis.

Circumflex system: No plaque or stenosis.

RCA system: No plaque or stenosis.
IMPRESSION: 1. Coronary artery calcium score 0 Agatston units, suggesting low
risk for future cardiac events.

2. Surprising based on age, but no significant coronary disease
noted in the coronary tree.

3.  Small secundum ASD.

4.  No anomalous pulmonary vein drainage.

Shwe Sarduy

EXAM:
OVER-READ INTERPRETATION  CT CHEST

The following report is an over-read performed by radiologist Dr.
Wendee Xyooj [REDACTED] on 04/12/2018. This
over-read does not include interpretation of cardiac or coronary
anatomy or pathology. The coronary calcium score/coronary CTA
interpretation by the cardiologist is attached.
FINDINGS: Scarring in the periphery of the inferior segment of the lingula
adjacent to multiple old healed rib fractures which have an
associated posttraumatic deformity. Within the visualized portions
of the thorax there are no suspicious appearing pulmonary nodules or
masses, there is no acute consolidative airspace disease, no pleural
effusions, no pneumothorax and no lymphadenopathy. Visualized
portions of the upper abdomen are unremarkable. There are no
aggressive appearing lytic or blastic lesions noted in the
visualized portions of the skeleton.
IMPRESSION: 1. Multiple old healed left-sided rib fractures with posttraumatic
deformity and underlying scarring in the inferior segment of the
lingula.

## 2020-04-05 DIAGNOSIS — R05 Cough: Secondary | ICD-10-CM | POA: Diagnosis not present

## 2020-04-18 DIAGNOSIS — R197 Diarrhea, unspecified: Secondary | ICD-10-CM | POA: Diagnosis not present

## 2020-04-19 DIAGNOSIS — R197 Diarrhea, unspecified: Secondary | ICD-10-CM | POA: Diagnosis not present

## 2020-05-16 DIAGNOSIS — F329 Major depressive disorder, single episode, unspecified: Secondary | ICD-10-CM | POA: Diagnosis not present

## 2020-05-16 DIAGNOSIS — Z7289 Other problems related to lifestyle: Secondary | ICD-10-CM | POA: Diagnosis not present

## 2020-05-16 DIAGNOSIS — E782 Mixed hyperlipidemia: Secondary | ICD-10-CM | POA: Diagnosis not present

## 2020-05-16 DIAGNOSIS — Z6827 Body mass index (BMI) 27.0-27.9, adult: Secondary | ICD-10-CM | POA: Diagnosis not present

## 2020-05-16 DIAGNOSIS — I129 Hypertensive chronic kidney disease with stage 1 through stage 4 chronic kidney disease, or unspecified chronic kidney disease: Secondary | ICD-10-CM | POA: Diagnosis not present

## 2020-05-16 DIAGNOSIS — R739 Hyperglycemia, unspecified: Secondary | ICD-10-CM | POA: Diagnosis not present

## 2020-05-16 DIAGNOSIS — N183 Chronic kidney disease, stage 3 unspecified: Secondary | ICD-10-CM | POA: Diagnosis not present

## 2020-05-16 DIAGNOSIS — A0472 Enterocolitis due to Clostridium difficile, not specified as recurrent: Secondary | ICD-10-CM | POA: Diagnosis not present

## 2020-05-16 DIAGNOSIS — F419 Anxiety disorder, unspecified: Secondary | ICD-10-CM | POA: Diagnosis not present

## 2020-05-28 DIAGNOSIS — A0472 Enterocolitis due to Clostridium difficile, not specified as recurrent: Secondary | ICD-10-CM | POA: Diagnosis not present

## 2020-06-05 DIAGNOSIS — A0472 Enterocolitis due to Clostridium difficile, not specified as recurrent: Secondary | ICD-10-CM | POA: Diagnosis not present

## 2020-06-12 DIAGNOSIS — N182 Chronic kidney disease, stage 2 (mild): Secondary | ICD-10-CM | POA: Diagnosis not present

## 2020-06-12 DIAGNOSIS — Z524 Kidney donor: Secondary | ICD-10-CM | POA: Diagnosis not present

## 2020-06-12 DIAGNOSIS — I129 Hypertensive chronic kidney disease with stage 1 through stage 4 chronic kidney disease, or unspecified chronic kidney disease: Secondary | ICD-10-CM | POA: Diagnosis not present

## 2020-06-12 DIAGNOSIS — D509 Iron deficiency anemia, unspecified: Secondary | ICD-10-CM | POA: Diagnosis not present

## 2020-06-12 DIAGNOSIS — Z8774 Personal history of (corrected) congenital malformations of heart and circulatory system: Secondary | ICD-10-CM | POA: Diagnosis not present

## 2020-06-12 DIAGNOSIS — E7800 Pure hypercholesterolemia, unspecified: Secondary | ICD-10-CM | POA: Diagnosis not present

## 2020-06-12 DIAGNOSIS — N2581 Secondary hyperparathyroidism of renal origin: Secondary | ICD-10-CM | POA: Diagnosis not present

## 2020-06-12 DIAGNOSIS — M1990 Unspecified osteoarthritis, unspecified site: Secondary | ICD-10-CM | POA: Diagnosis not present

## 2020-06-22 DIAGNOSIS — M9902 Segmental and somatic dysfunction of thoracic region: Secondary | ICD-10-CM | POA: Diagnosis not present

## 2020-06-22 DIAGNOSIS — M9905 Segmental and somatic dysfunction of pelvic region: Secondary | ICD-10-CM | POA: Diagnosis not present

## 2020-06-22 DIAGNOSIS — M9904 Segmental and somatic dysfunction of sacral region: Secondary | ICD-10-CM | POA: Diagnosis not present

## 2020-06-22 DIAGNOSIS — M9903 Segmental and somatic dysfunction of lumbar region: Secondary | ICD-10-CM | POA: Diagnosis not present

## 2020-06-27 DIAGNOSIS — M9903 Segmental and somatic dysfunction of lumbar region: Secondary | ICD-10-CM | POA: Diagnosis not present

## 2020-06-27 DIAGNOSIS — M9904 Segmental and somatic dysfunction of sacral region: Secondary | ICD-10-CM | POA: Diagnosis not present

## 2020-06-27 DIAGNOSIS — M9902 Segmental and somatic dysfunction of thoracic region: Secondary | ICD-10-CM | POA: Diagnosis not present

## 2020-06-27 DIAGNOSIS — M9905 Segmental and somatic dysfunction of pelvic region: Secondary | ICD-10-CM | POA: Diagnosis not present

## 2020-07-04 DIAGNOSIS — M9905 Segmental and somatic dysfunction of pelvic region: Secondary | ICD-10-CM | POA: Diagnosis not present

## 2020-07-04 DIAGNOSIS — M9902 Segmental and somatic dysfunction of thoracic region: Secondary | ICD-10-CM | POA: Diagnosis not present

## 2020-07-04 DIAGNOSIS — M9903 Segmental and somatic dysfunction of lumbar region: Secondary | ICD-10-CM | POA: Diagnosis not present

## 2020-07-04 DIAGNOSIS — M9904 Segmental and somatic dysfunction of sacral region: Secondary | ICD-10-CM | POA: Diagnosis not present

## 2020-07-11 DIAGNOSIS — M9905 Segmental and somatic dysfunction of pelvic region: Secondary | ICD-10-CM | POA: Diagnosis not present

## 2020-07-11 DIAGNOSIS — M9902 Segmental and somatic dysfunction of thoracic region: Secondary | ICD-10-CM | POA: Diagnosis not present

## 2020-07-11 DIAGNOSIS — M9904 Segmental and somatic dysfunction of sacral region: Secondary | ICD-10-CM | POA: Diagnosis not present

## 2020-07-11 DIAGNOSIS — M9903 Segmental and somatic dysfunction of lumbar region: Secondary | ICD-10-CM | POA: Diagnosis not present

## 2020-09-03 DIAGNOSIS — B349 Viral infection, unspecified: Secondary | ICD-10-CM | POA: Diagnosis not present

## 2020-09-03 DIAGNOSIS — Z20828 Contact with and (suspected) exposure to other viral communicable diseases: Secondary | ICD-10-CM | POA: Diagnosis not present

## 2020-09-19 DIAGNOSIS — I129 Hypertensive chronic kidney disease with stage 1 through stage 4 chronic kidney disease, or unspecified chronic kidney disease: Secondary | ICD-10-CM | POA: Diagnosis not present

## 2020-09-19 DIAGNOSIS — E782 Mixed hyperlipidemia: Secondary | ICD-10-CM | POA: Diagnosis not present

## 2020-09-19 DIAGNOSIS — F32A Depression, unspecified: Secondary | ICD-10-CM | POA: Diagnosis not present

## 2020-09-19 DIAGNOSIS — Z7289 Other problems related to lifestyle: Secondary | ICD-10-CM | POA: Diagnosis not present

## 2020-09-19 DIAGNOSIS — N183 Chronic kidney disease, stage 3 unspecified: Secondary | ICD-10-CM | POA: Diagnosis not present

## 2020-09-19 DIAGNOSIS — F419 Anxiety disorder, unspecified: Secondary | ICD-10-CM | POA: Diagnosis not present

## 2020-09-19 DIAGNOSIS — Z23 Encounter for immunization: Secondary | ICD-10-CM | POA: Diagnosis not present

## 2020-09-19 DIAGNOSIS — R739 Hyperglycemia, unspecified: Secondary | ICD-10-CM | POA: Diagnosis not present

## 2020-09-19 DIAGNOSIS — Z125 Encounter for screening for malignant neoplasm of prostate: Secondary | ICD-10-CM | POA: Diagnosis not present

## 2020-09-27 DIAGNOSIS — Z0000 Encounter for general adult medical examination without abnormal findings: Secondary | ICD-10-CM | POA: Diagnosis not present

## 2020-09-27 DIAGNOSIS — E785 Hyperlipidemia, unspecified: Secondary | ICD-10-CM | POA: Diagnosis not present

## 2020-09-27 DIAGNOSIS — Z1331 Encounter for screening for depression: Secondary | ICD-10-CM | POA: Diagnosis not present

## 2020-09-27 DIAGNOSIS — Z139 Encounter for screening, unspecified: Secondary | ICD-10-CM | POA: Diagnosis not present

## 2020-09-27 DIAGNOSIS — Z9181 History of falling: Secondary | ICD-10-CM | POA: Diagnosis not present

## 2020-10-02 DIAGNOSIS — L853 Xerosis cutis: Secondary | ICD-10-CM | POA: Diagnosis not present

## 2020-10-02 DIAGNOSIS — L570 Actinic keratosis: Secondary | ICD-10-CM | POA: Diagnosis not present

## 2020-10-02 DIAGNOSIS — L820 Inflamed seborrheic keratosis: Secondary | ICD-10-CM | POA: Diagnosis not present

## 2020-10-02 DIAGNOSIS — L821 Other seborrheic keratosis: Secondary | ICD-10-CM | POA: Diagnosis not present

## 2020-10-02 DIAGNOSIS — L578 Other skin changes due to chronic exposure to nonionizing radiation: Secondary | ICD-10-CM | POA: Diagnosis not present

## 2020-10-15 DIAGNOSIS — Z20828 Contact with and (suspected) exposure to other viral communicable diseases: Secondary | ICD-10-CM | POA: Diagnosis not present

## 2020-10-15 DIAGNOSIS — Z1152 Encounter for screening for COVID-19: Secondary | ICD-10-CM | POA: Diagnosis not present

## 2020-10-31 DIAGNOSIS — Z2821 Immunization not carried out because of patient refusal: Secondary | ICD-10-CM | POA: Diagnosis not present

## 2020-10-31 DIAGNOSIS — Z23 Encounter for immunization: Secondary | ICD-10-CM | POA: Diagnosis not present

## 2020-10-31 DIAGNOSIS — J329 Chronic sinusitis, unspecified: Secondary | ICD-10-CM | POA: Diagnosis not present

## 2020-10-31 DIAGNOSIS — Z6827 Body mass index (BMI) 27.0-27.9, adult: Secondary | ICD-10-CM | POA: Diagnosis not present

## 2020-11-15 DIAGNOSIS — Z20828 Contact with and (suspected) exposure to other viral communicable diseases: Secondary | ICD-10-CM | POA: Diagnosis not present

## 2020-11-15 DIAGNOSIS — Z1159 Encounter for screening for other viral diseases: Secondary | ICD-10-CM | POA: Diagnosis not present

## 2020-12-05 DIAGNOSIS — N529 Male erectile dysfunction, unspecified: Secondary | ICD-10-CM | POA: Diagnosis not present

## 2020-12-05 DIAGNOSIS — Z7289 Other problems related to lifestyle: Secondary | ICD-10-CM | POA: Diagnosis not present

## 2020-12-05 DIAGNOSIS — I129 Hypertensive chronic kidney disease with stage 1 through stage 4 chronic kidney disease, or unspecified chronic kidney disease: Secondary | ICD-10-CM | POA: Diagnosis not present

## 2020-12-05 DIAGNOSIS — E782 Mixed hyperlipidemia: Secondary | ICD-10-CM | POA: Diagnosis not present

## 2020-12-05 DIAGNOSIS — N183 Chronic kidney disease, stage 3 unspecified: Secondary | ICD-10-CM | POA: Diagnosis not present

## 2020-12-05 DIAGNOSIS — R748 Abnormal levels of other serum enzymes: Secondary | ICD-10-CM | POA: Diagnosis not present

## 2020-12-05 DIAGNOSIS — F419 Anxiety disorder, unspecified: Secondary | ICD-10-CM | POA: Diagnosis not present

## 2020-12-05 DIAGNOSIS — F32A Depression, unspecified: Secondary | ICD-10-CM | POA: Diagnosis not present

## 2021-01-21 DIAGNOSIS — I129 Hypertensive chronic kidney disease with stage 1 through stage 4 chronic kidney disease, or unspecified chronic kidney disease: Secondary | ICD-10-CM | POA: Diagnosis not present

## 2021-01-21 DIAGNOSIS — G4700 Insomnia, unspecified: Secondary | ICD-10-CM | POA: Diagnosis not present

## 2021-01-21 DIAGNOSIS — R21 Rash and other nonspecific skin eruption: Secondary | ICD-10-CM | POA: Diagnosis not present

## 2021-01-21 DIAGNOSIS — N183 Chronic kidney disease, stage 3 unspecified: Secondary | ICD-10-CM | POA: Diagnosis not present

## 2021-01-21 DIAGNOSIS — Z6827 Body mass index (BMI) 27.0-27.9, adult: Secondary | ICD-10-CM | POA: Diagnosis not present

## 2021-01-31 DIAGNOSIS — R5382 Chronic fatigue, unspecified: Secondary | ICD-10-CM | POA: Diagnosis not present

## 2021-01-31 DIAGNOSIS — J069 Acute upper respiratory infection, unspecified: Secondary | ICD-10-CM | POA: Diagnosis not present

## 2021-02-06 DIAGNOSIS — G4700 Insomnia, unspecified: Secondary | ICD-10-CM | POA: Diagnosis not present

## 2021-02-06 DIAGNOSIS — D509 Iron deficiency anemia, unspecified: Secondary | ICD-10-CM | POA: Diagnosis not present

## 2021-02-06 DIAGNOSIS — N183 Chronic kidney disease, stage 3 unspecified: Secondary | ICD-10-CM | POA: Diagnosis not present

## 2021-02-06 DIAGNOSIS — I129 Hypertensive chronic kidney disease with stage 1 through stage 4 chronic kidney disease, or unspecified chronic kidney disease: Secondary | ICD-10-CM | POA: Diagnosis not present

## 2021-02-06 DIAGNOSIS — E7800 Pure hypercholesterolemia, unspecified: Secondary | ICD-10-CM | POA: Diagnosis not present

## 2021-02-06 DIAGNOSIS — Z8774 Personal history of (corrected) congenital malformations of heart and circulatory system: Secondary | ICD-10-CM | POA: Diagnosis not present

## 2021-02-06 DIAGNOSIS — N2581 Secondary hyperparathyroidism of renal origin: Secondary | ICD-10-CM | POA: Diagnosis not present

## 2021-02-06 DIAGNOSIS — Z524 Kidney donor: Secondary | ICD-10-CM | POA: Diagnosis not present

## 2021-02-06 DIAGNOSIS — M1990 Unspecified osteoarthritis, unspecified site: Secondary | ICD-10-CM | POA: Diagnosis not present

## 2021-02-06 DIAGNOSIS — N182 Chronic kidney disease, stage 2 (mild): Secondary | ICD-10-CM | POA: Diagnosis not present

## 2021-02-07 DIAGNOSIS — M19012 Primary osteoarthritis, left shoulder: Secondary | ICD-10-CM | POA: Diagnosis not present

## 2021-02-07 DIAGNOSIS — I129 Hypertensive chronic kidney disease with stage 1 through stage 4 chronic kidney disease, or unspecified chronic kidney disease: Secondary | ICD-10-CM | POA: Diagnosis not present

## 2021-02-12 ENCOUNTER — Other Ambulatory Visit: Payer: Self-pay | Admitting: Nephrology

## 2021-02-12 DIAGNOSIS — N182 Chronic kidney disease, stage 2 (mild): Secondary | ICD-10-CM

## 2021-02-20 DIAGNOSIS — N183 Chronic kidney disease, stage 3 unspecified: Secondary | ICD-10-CM | POA: Diagnosis not present

## 2021-02-20 DIAGNOSIS — I129 Hypertensive chronic kidney disease with stage 1 through stage 4 chronic kidney disease, or unspecified chronic kidney disease: Secondary | ICD-10-CM | POA: Diagnosis not present

## 2021-02-22 ENCOUNTER — Ambulatory Visit
Admission: RE | Admit: 2021-02-22 | Discharge: 2021-02-22 | Disposition: A | Discharge status: Home / Self Care | Payer: Medicare Other | Source: Ambulatory Visit | Attending: Nephrology | Admitting: Nephrology

## 2021-02-22 DIAGNOSIS — N189 Chronic kidney disease, unspecified: Secondary | ICD-10-CM | POA: Diagnosis not present

## 2021-02-22 DIAGNOSIS — N401 Enlarged prostate with lower urinary tract symptoms: Secondary | ICD-10-CM | POA: Diagnosis not present

## 2021-02-22 DIAGNOSIS — N182 Chronic kidney disease, stage 2 (mild): Secondary | ICD-10-CM

## 2021-03-04 ENCOUNTER — Telehealth: Payer: Self-pay | Admitting: Cardiovascular Disease

## 2021-03-04 NOTE — Telephone Encounter (Signed)
Needs to talk to Dr. Earmon Phoenix office but can't get through the call center. Please call [phone].  Thank you!  Domingo Dimes

## 2021-03-05 ENCOUNTER — Telehealth: Payer: Self-pay

## 2021-03-05 NOTE — Telephone Encounter (Signed)
Referral notes sent from [facility], Phone #: [phone], Fax #: [phone]   Notes sent to scheduling

## 2021-03-05 NOTE — Telephone Encounter (Signed)
The patient reports he saw Dr. Arrie Aran recently and was told he would be doing a procedure. The patient is unsure whether cardiac clearance is necessary.   Called Dr. Hadley Pen office. Left message for his assistant to fax over cardiac clearance if needed to fax #[phone].

## 2021-03-07 ENCOUNTER — Telehealth: Payer: Self-pay | Admitting: Cardiovascular Disease

## 2021-03-07 NOTE — Telephone Encounter (Signed)
Left message for patient to call and schedule New Patient visit with Dr. Allyson Sabal as requested by Dr. Arrie Aran ---

## 2021-03-20 DIAGNOSIS — Z6826 Body mass index (BMI) 26.0-26.9, adult: Secondary | ICD-10-CM | POA: Diagnosis not present

## 2021-03-20 DIAGNOSIS — I129 Hypertensive chronic kidney disease with stage 1 through stage 4 chronic kidney disease, or unspecified chronic kidney disease: Secondary | ICD-10-CM | POA: Diagnosis not present

## 2021-03-20 DIAGNOSIS — N183 Chronic kidney disease, stage 3 unspecified: Secondary | ICD-10-CM | POA: Diagnosis not present

## 2021-03-20 DIAGNOSIS — G4700 Insomnia, unspecified: Secondary | ICD-10-CM | POA: Diagnosis not present

## 2021-03-27 ENCOUNTER — Encounter: Payer: Self-pay | Admitting: Cardiovascular Disease

## 2021-03-27 ENCOUNTER — Ambulatory Visit: Payer: Medicare PPO | Admitting: Cardiovascular Disease

## 2021-03-27 ENCOUNTER — Other Ambulatory Visit: Payer: Self-pay

## 2021-03-27 VITALS — BP 120/84 | HR 96 | Ht 70.0 in | Wt 179.0 lb

## 2021-03-27 DIAGNOSIS — I10 Essential (primary) hypertension: Secondary | ICD-10-CM

## 2021-03-27 DIAGNOSIS — E782 Mixed hyperlipidemia: Secondary | ICD-10-CM

## 2021-03-27 NOTE — Progress Notes (Addendum)
04/03/2021 Marcus Wise   01/14/1946  [id number]  Primary Physician Marlyn Corporal, PA Primary Cardiologist: [name] Nicholes Calamity, [place]  HPI:  Marcus Wise is a 75 y.o. thin and fit appearing widowed Caucasian male father of 1 daughter Marcus Wise who accompanies him today) with no grandchildren who was referred by his nephrologist, Dr. Arrie Aran, for evaluation and consideration of renal intervention for question renovascular hypertension.  He does have treated hypertension on Altace 10 mg a day.  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  He did have an Amplatz occluder placed because of a paradoxic cerebral embolus by Dr. Excell Seltzer 05/13/2018.  He apparently had some spikes in his blood pressure recently.  A renal Doppler study performed 02/22/2021 showed a pole-to-pole dimension of 13.1 cm with a peak velocity at the origin of the right renal artery of 257 cm/s giving a renal aortic ratio of 3.4, borderline significant.   Current Meds  Medication Sig  . aspirin EC 81 MG tablet Take 1 tablet (81 mg total) by mouth daily.  . Multiple Vitamin (MULTIVITAMIN) tablet Take 1 tablet by mouth daily.  . Omega-3 Fatty Acids (FISH OIL OMEGA-3 PO) Take 1 capsule by mouth daily.  . ramipril (ALTACE) 10 MG capsule Take 10 mg by mouth daily.   . [DISCONTINUED] Coenzyme Q10 (CO Q-10) 200 MG CAPS Take 200 mg by mouth daily.     Allergies  Allergen Reactions  . Statins Other (See Comments)    Aching joints.    Social History   Socioeconomic History  . Marital status: Married    Spouse name: Not on file  . Number of children: Not on file  . Years of education: Not on file  . Highest education level: Not on file  Occupational History  . Not on file  Tobacco Use  . Smoking status: Never Smoker  . Smokeless tobacco: Never Used  Substance and Sexual Activity  . Alcohol use: Not on file  . Drug use: Not  on file  . Sexual activity: Not on file  Other Topics Concern  . Not on file  Social History Narrative  . Not on file   Social Determinants of Health   Financial Resource Strain: Not on file  Food Insecurity: Not on file  Transportation Needs: Not on file  Physical Activity: Not on file  Stress: Not on file  Social Connections: Not on file  Intimate Partner Violence: Not on file     Review of Systems: General: negative for chills, fever, night sweats or weight changes.  Cardiovascular: negative for chest pain, dyspnea on exertion, edema, orthopnea, palpitations, paroxysmal nocturnal dyspnea or shortness of breath Dermatological: negative for rash Respiratory: negative for cough or wheezing Urologic: negative for hematuria Abdominal: negative for nausea, vomiting, diarrhea, bright red blood per rectum, melena, or hematemesis Neurologic: negative for visual changes, syncope, or dizziness All other systems reviewed and are otherwise negative except as noted above.    Blood pressure 120/84, pulse 96, height 5\' 10"  (1.778 m), weight 179 lb (81.2 kg).  General appearance: alert and no distress Neck: no adenopathy, no carotid bruit, no JVD, supple, symmetrical, trachea midline and thyroid not enlarged, symmetric, no tenderness/mass/nodules Lungs: clear to auscultation bilaterally Heart: regular rate and rhythm, S1, S2 normal, no murmur, click, rub or gallop Extremities: extremities normal, atraumatic, no cyanosis or edema Pulses: 2+ and  symmetric Skin: Skin color, texture, turgor normal. No rashes or lesions Neurologic: Alert and oriented X 3, normal strength and tone. Normal symmetric reflexes. Normal coordination and gait  EKG sinus rhythm at 97 with occasional PVCs and borderline LVH voltage.  I personally reviewed this EKG.  ASSESSMENT AND PLAN:   Hypertension Mr. Bachtel was referred to me by Dr. 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  He donated a kidney to his wife approximately 12 to 15 years ago.  His serum creatinine is 1.3 and his blood pressure today in the office is 120/84.  He is on ramipril 10 mg a day.  He had a renal Doppler study performed 02/22/2021 revealing a renal dimension of 13.1 cm with peak velocity at the origin of his right renal artery of 257 cm/s giving a renal Travor Royce aortic ratio of 3.4 which was borderline significant.  A renal aortic ratio of 3.5 confers a 50% or greater renal artery stenosis.  Given the borderline nature of the renal Doppler study and is fairly controlled blood pressure on 1 medication I would rather pursue a confirmatory noninvasive test such as MRA (not nephrotoxic) to confirm whether or not he has significant renal artery stenosis prior to making decision regarding an invasive approach.  Hyperlipidemia History of hyperlipidemia not on statin therapy because of statin intolerance with recent lipid profile performed by his PCP 02/06/2021 revealing total Lester 183, LDL of 119 and HDL of 50.      [name] FACP,FACC,FAHA, [facility] 04/03/2021 5:07 PM

## 2021-03-27 NOTE — Assessment & Plan Note (Signed)
Marcus Wise was referred to me by Dr. 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  He donated a kidney to his wife approximately 12 to 15 years ago.  His serum creatinine is 1.3 and his blood pressure today in the office is 120/84.  He is on ramipril 10 mg a day.  He had a renal Doppler study performed 02/22/2021 revealing a renal dimension of 13.1 cm with peak velocity at the origin of his right renal artery of 257 cm/s giving a renal [name] aortic ratio of 3.4 which was borderline significant.  A renal aortic ratio of 3.5 confers a 50% or greater renal artery stenosis.  Given the borderline nature of the renal Doppler study and is fairly controlled blood pressure on 1 medication I would rather pursue a confirmatory noninvasive test such as MRA (not nephrotoxic) to confirm whether or not he has significant renal artery stenosis prior to making decision regarding an invasive approach.

## 2021-03-27 NOTE — Patient Instructions (Signed)
Medication Instructions:  No Changes In Medications at this time.  *If you need a refill on your cardiac medications before your next appointment, please call your pharmacy*  Testing/Procedures: RENAL MRA- YOUR NEPHROLOGIST WILL SET THIS UP  Follow-Up: At [facility], you and your health needs are our priority.  As part of our continuing mission to provide you with exceptional heart care, we have created designated Provider Care Teams.  These Care Teams include your primary Cardiologist (physician) and Advanced Practice Providers (APPs -  Physician Assistants and Nurse Practitioners) who all work together to provide you with the care you need, when you need it.  Your next appointment:   AS NEEDED   The format for your next appointment:   In Person  Provider:   Nanetta Batty, MD

## 2021-03-29 ENCOUNTER — Other Ambulatory Visit: Payer: Self-pay | Admitting: Nephrology

## 2021-03-29 ENCOUNTER — Other Ambulatory Visit (HOSPITAL_COMMUNITY): Payer: Self-pay | Admitting: Nephrology

## 2021-03-29 DIAGNOSIS — N289 Disorder of kidney and ureter, unspecified: Secondary | ICD-10-CM

## 2021-03-29 DIAGNOSIS — I10 Essential (primary) hypertension: Secondary | ICD-10-CM

## 2021-04-03 NOTE — Assessment & Plan Note (Signed)
History of hyperlipidemia not on statin therapy because of statin intolerance with recent lipid profile performed by his PCP 02/06/2021 revealing total Lester 183, LDL of 119 and HDL of 50.

## 2021-04-05 ENCOUNTER — Encounter (HOSPITAL_COMMUNITY): Payer: Self-pay

## 2021-04-05 ENCOUNTER — Ambulatory Visit (HOSPITAL_COMMUNITY): Admission: RE | Admit: 2021-04-05 | Payer: Medicare PPO | Source: Ambulatory Visit

## 2021-04-15 ENCOUNTER — Ambulatory Visit (HOSPITAL_COMMUNITY)
Admission: RE | Admit: 2021-04-15 | Discharge: 2021-04-15 | Disposition: A | Discharge status: Home / Self Care | Payer: Medicare PPO | Source: Ambulatory Visit | Attending: Nephrology | Admitting: Nephrology

## 2021-04-15 ENCOUNTER — Other Ambulatory Visit: Payer: Self-pay

## 2021-04-15 DIAGNOSIS — K5730 Diverticulosis of large intestine without perforation or abscess without bleeding: Secondary | ICD-10-CM | POA: Diagnosis not present

## 2021-04-15 DIAGNOSIS — N289 Disorder of kidney and ureter, unspecified: Secondary | ICD-10-CM | POA: Diagnosis not present

## 2021-04-15 DIAGNOSIS — I10 Essential (primary) hypertension: Secondary | ICD-10-CM

## 2021-04-15 DIAGNOSIS — I722 Aneurysm of renal artery: Secondary | ICD-10-CM | POA: Diagnosis not present

## 2021-04-15 DIAGNOSIS — N2889 Other specified disorders of kidney and ureter: Secondary | ICD-10-CM | POA: Diagnosis not present

## 2021-04-15 DIAGNOSIS — I129 Hypertensive chronic kidney disease with stage 1 through stage 4 chronic kidney disease, or unspecified chronic kidney disease: Secondary | ICD-10-CM | POA: Diagnosis not present

## 2021-04-15 MED ORDER — GADOBUTROL 1 MMOL/ML IV SOLN
8.0000 mL | Freq: Once | INTRAVENOUS | Status: AC | PRN
  Administered 2021-04-15: 8 mL via INTRAVENOUS

## 2021-05-09 DIAGNOSIS — Z905 Acquired absence of kidney: Secondary | ICD-10-CM | POA: Diagnosis not present

## 2021-05-09 DIAGNOSIS — I10 Essential (primary) hypertension: Secondary | ICD-10-CM | POA: Diagnosis not present

## 2021-05-09 DIAGNOSIS — Z7141 Alcohol abuse counseling and surveillance of alcoholic: Secondary | ICD-10-CM | POA: Diagnosis not present

## 2021-05-09 DIAGNOSIS — Z742 Need for assistance at home and no other household member able to render care: Secondary | ICD-10-CM | POA: Diagnosis not present

## 2021-05-09 DIAGNOSIS — F32A Depression, unspecified: Secondary | ICD-10-CM | POA: Diagnosis not present

## 2021-05-09 DIAGNOSIS — F10230 Alcohol dependence with withdrawal, uncomplicated: Secondary | ICD-10-CM | POA: Diagnosis not present

## 2021-05-09 DIAGNOSIS — Z524 Kidney donor: Secondary | ICD-10-CM | POA: Diagnosis not present

## 2021-05-09 DIAGNOSIS — R112 Nausea with vomiting, unspecified: Secondary | ICD-10-CM | POA: Diagnosis not present

## 2021-05-15 DIAGNOSIS — M1990 Unspecified osteoarthritis, unspecified site: Secondary | ICD-10-CM | POA: Diagnosis not present

## 2021-05-15 DIAGNOSIS — Z8774 Personal history of (corrected) congenital malformations of heart and circulatory system: Secondary | ICD-10-CM | POA: Diagnosis not present

## 2021-05-15 DIAGNOSIS — D509 Iron deficiency anemia, unspecified: Secondary | ICD-10-CM | POA: Diagnosis not present

## 2021-05-15 DIAGNOSIS — Z524 Kidney donor: Secondary | ICD-10-CM | POA: Diagnosis not present

## 2021-05-15 DIAGNOSIS — N2581 Secondary hyperparathyroidism of renal origin: Secondary | ICD-10-CM | POA: Diagnosis not present

## 2021-05-15 DIAGNOSIS — I129 Hypertensive chronic kidney disease with stage 1 through stage 4 chronic kidney disease, or unspecified chronic kidney disease: Secondary | ICD-10-CM | POA: Diagnosis not present

## 2021-05-15 DIAGNOSIS — N182 Chronic kidney disease, stage 2 (mild): Secondary | ICD-10-CM | POA: Diagnosis not present

## 2021-05-16 DIAGNOSIS — Z524 Kidney donor: Secondary | ICD-10-CM | POA: Diagnosis not present

## 2021-05-16 DIAGNOSIS — I10 Essential (primary) hypertension: Secondary | ICD-10-CM | POA: Diagnosis not present

## 2021-05-16 DIAGNOSIS — E860 Dehydration: Secondary | ICD-10-CM | POA: Diagnosis not present

## 2021-05-16 DIAGNOSIS — F10230 Alcohol dependence with withdrawal, uncomplicated: Secondary | ICD-10-CM | POA: Diagnosis not present

## 2021-05-16 DIAGNOSIS — Z8673 Personal history of transient ischemic attack (TIA), and cerebral infarction without residual deficits: Secondary | ICD-10-CM | POA: Diagnosis not present

## 2021-05-16 DIAGNOSIS — E7800 Pure hypercholesterolemia, unspecified: Secondary | ICD-10-CM | POA: Diagnosis not present

## 2021-05-16 DIAGNOSIS — Z905 Acquired absence of kidney: Secondary | ICD-10-CM | POA: Diagnosis not present

## 2021-05-16 DIAGNOSIS — F32A Depression, unspecified: Secondary | ICD-10-CM | POA: Diagnosis not present

## 2021-05-17 DIAGNOSIS — Z7689 Persons encountering health services in other specified circumstances: Secondary | ICD-10-CM | POA: Diagnosis not present

## 2021-05-17 DIAGNOSIS — Z09 Encounter for follow-up examination after completed treatment for conditions other than malignant neoplasm: Secondary | ICD-10-CM | POA: Diagnosis not present

## 2021-05-17 DIAGNOSIS — N529 Male erectile dysfunction, unspecified: Secondary | ICD-10-CM | POA: Diagnosis not present

## 2021-05-17 DIAGNOSIS — M25512 Pain in left shoulder: Secondary | ICD-10-CM | POA: Diagnosis not present

## 2021-05-17 DIAGNOSIS — F10239 Alcohol dependence with withdrawal, unspecified: Secondary | ICD-10-CM | POA: Diagnosis not present

## 2021-05-17 DIAGNOSIS — Z6826 Body mass index (BMI) 26.0-26.9, adult: Secondary | ICD-10-CM | POA: Diagnosis not present

## 2021-05-19 DIAGNOSIS — E860 Dehydration: Secondary | ICD-10-CM | POA: Diagnosis not present

## 2021-05-19 DIAGNOSIS — Z8673 Personal history of transient ischemic attack (TIA), and cerebral infarction without residual deficits: Secondary | ICD-10-CM | POA: Diagnosis not present

## 2021-05-19 DIAGNOSIS — F32A Depression, unspecified: Secondary | ICD-10-CM | POA: Diagnosis not present

## 2021-05-19 DIAGNOSIS — Z905 Acquired absence of kidney: Secondary | ICD-10-CM | POA: Diagnosis not present

## 2021-05-19 DIAGNOSIS — Z524 Kidney donor: Secondary | ICD-10-CM | POA: Diagnosis not present

## 2021-05-19 DIAGNOSIS — E7800 Pure hypercholesterolemia, unspecified: Secondary | ICD-10-CM | POA: Diagnosis not present

## 2021-05-19 DIAGNOSIS — I10 Essential (primary) hypertension: Secondary | ICD-10-CM | POA: Diagnosis not present

## 2021-05-19 DIAGNOSIS — F10230 Alcohol dependence with withdrawal, uncomplicated: Secondary | ICD-10-CM | POA: Diagnosis not present

## 2021-05-21 DIAGNOSIS — I10 Essential (primary) hypertension: Secondary | ICD-10-CM | POA: Diagnosis not present

## 2021-05-21 DIAGNOSIS — Z524 Kidney donor: Secondary | ICD-10-CM | POA: Diagnosis not present

## 2021-05-21 DIAGNOSIS — Z905 Acquired absence of kidney: Secondary | ICD-10-CM | POA: Diagnosis not present

## 2021-05-21 DIAGNOSIS — Z8673 Personal history of transient ischemic attack (TIA), and cerebral infarction without residual deficits: Secondary | ICD-10-CM | POA: Diagnosis not present

## 2021-05-21 DIAGNOSIS — E7800 Pure hypercholesterolemia, unspecified: Secondary | ICD-10-CM | POA: Diagnosis not present

## 2021-05-21 DIAGNOSIS — E860 Dehydration: Secondary | ICD-10-CM | POA: Diagnosis not present

## 2021-05-21 DIAGNOSIS — F10230 Alcohol dependence with withdrawal, uncomplicated: Secondary | ICD-10-CM | POA: Diagnosis not present

## 2021-05-21 DIAGNOSIS — F32A Depression, unspecified: Secondary | ICD-10-CM | POA: Diagnosis not present

## 2021-05-28 DIAGNOSIS — F10230 Alcohol dependence with withdrawal, uncomplicated: Secondary | ICD-10-CM | POA: Diagnosis not present

## 2021-05-28 DIAGNOSIS — F32A Depression, unspecified: Secondary | ICD-10-CM | POA: Diagnosis not present

## 2021-05-28 DIAGNOSIS — Z8673 Personal history of transient ischemic attack (TIA), and cerebral infarction without residual deficits: Secondary | ICD-10-CM | POA: Diagnosis not present

## 2021-05-28 DIAGNOSIS — Z524 Kidney donor: Secondary | ICD-10-CM | POA: Diagnosis not present

## 2021-05-28 DIAGNOSIS — I10 Essential (primary) hypertension: Secondary | ICD-10-CM | POA: Diagnosis not present

## 2021-05-28 DIAGNOSIS — I129 Hypertensive chronic kidney disease with stage 1 through stage 4 chronic kidney disease, or unspecified chronic kidney disease: Secondary | ICD-10-CM | POA: Diagnosis not present

## 2021-05-28 DIAGNOSIS — E860 Dehydration: Secondary | ICD-10-CM | POA: Diagnosis not present

## 2021-05-28 DIAGNOSIS — Z905 Acquired absence of kidney: Secondary | ICD-10-CM | POA: Diagnosis not present

## 2021-05-28 DIAGNOSIS — E7800 Pure hypercholesterolemia, unspecified: Secondary | ICD-10-CM | POA: Diagnosis not present

## 2021-06-01 ENCOUNTER — Emergency Department (HOSPITAL_COMMUNITY): Payer: Medicare PPO

## 2021-06-01 ENCOUNTER — Emergency Department (HOSPITAL_COMMUNITY)
Admission: EM | Admit: 2021-06-01 | Discharge: 2021-06-02 | Disposition: A | Discharge status: Home / Self Care | Payer: Medicare PPO | Attending: Emergency Medicine | Admitting: Emergency Medicine

## 2021-06-01 ENCOUNTER — Other Ambulatory Visit: Payer: Self-pay

## 2021-06-01 ENCOUNTER — Encounter (HOSPITAL_COMMUNITY): Payer: Self-pay

## 2021-06-01 DIAGNOSIS — I10 Essential (primary) hypertension: Secondary | ICD-10-CM | POA: Insufficient documentation

## 2021-06-01 DIAGNOSIS — Z7982 Long term (current) use of aspirin: Secondary | ICD-10-CM | POA: Insufficient documentation

## 2021-06-01 DIAGNOSIS — F10239 Alcohol dependence with withdrawal, unspecified: Secondary | ICD-10-CM | POA: Diagnosis not present

## 2021-06-01 DIAGNOSIS — F10930 Alcohol use, unspecified with withdrawal, uncomplicated: Secondary | ICD-10-CM

## 2021-06-01 DIAGNOSIS — R0602 Shortness of breath: Secondary | ICD-10-CM | POA: Diagnosis not present

## 2021-06-01 DIAGNOSIS — Z79899 Other long term (current) drug therapy: Secondary | ICD-10-CM | POA: Insufficient documentation

## 2021-06-01 DIAGNOSIS — F10230 Alcohol dependence with withdrawal, uncomplicated: Secondary | ICD-10-CM

## 2021-06-01 DIAGNOSIS — R002 Palpitations: Secondary | ICD-10-CM | POA: Diagnosis not present

## 2021-06-01 LAB — BASIC METABOLIC PANEL
Anion gap: 11 (ref 5–15)
BUN: 10 mg/dL (ref 8–23)
CO2: 24 mmol/L (ref 22–32)
Calcium: 9.7 mg/dL (ref 8.9–10.3)
Chloride: 103 mmol/L (ref 98–111)
Creatinine, Ser: 1.01 mg/dL (ref 0.61–1.24)
GFR, Estimated: >60 mL/min (ref >60)
Glucose, Bld: 118 mg/dL — ABNORMAL HIGH (ref 70–99)
Potassium: 3.7 mmol/L (ref 3.5–5.1)
Sodium: 138 mmol/L (ref 135–145)

## 2021-06-01 LAB — CBC
HCT: 46.3 % (ref 39.0–52.0)
Hemoglobin: 16.4 g/dL (ref 13.0–17.0)
MCH: 33.7 pg (ref 26.0–34.0)
MCHC: 35.4 g/dL (ref 30.0–36.0)
MCV: 95.1 fL (ref 80.0–100.0)
Platelets: 448 10*3/uL — ABNORMAL HIGH (ref 150–400)
RBC: 4.87 MIL/uL (ref 4.22–5.81)
RDW: 13.5 % (ref 11.5–15.5)
WBC: 7.7 10*3/uL (ref 4.0–10.5)
nRBC: 0 % (ref 0.0–0.2)

## 2021-06-01 LAB — TROPONIN I (HIGH SENSITIVITY)
Troponin I (High Sensitivity): 12 ng/L (ref <18)
Troponin I (High Sensitivity): 14 ng/L (ref <18)

## 2021-06-01 MED ORDER — LORAZEPAM 1 MG PO TABS: 0.0000 mg | ORAL_TABLET | Freq: Two times a day (BID) | ORAL | Status: DC

## 2021-06-01 MED ORDER — LORAZEPAM 2 MG/ML IJ SOLN
0.0000 mg | Freq: Four times a day (QID) | INTRAMUSCULAR | Status: DC
  Administered 2021-06-01: 1 mg via INTRAVENOUS
  Filled 2021-06-01: qty 1

## 2021-06-01 MED ORDER — LORAZEPAM 2 MG/ML IJ SOLN: 1.0000 mg | Freq: Once | INTRAMUSCULAR | Status: DC

## 2021-06-01 MED ORDER — CHLORDIAZEPOXIDE HCL 25 MG PO CAPS: 50.0000 mg | ORAL_CAPSULE | Freq: Once | ORAL | Status: DC

## 2021-06-01 MED ORDER — LACTATED RINGERS IV SOLN: INTRAVENOUS | Status: DC

## 2021-06-01 MED ORDER — THIAMINE HCL 100 MG/ML IJ SOLN: 100.0000 mg | Freq: Every day | INTRAMUSCULAR | Status: DC

## 2021-06-01 MED ORDER — LACTATED RINGERS IV BOLUS
1000.0000 mL | Freq: Once | INTRAVENOUS | Status: AC
  Administered 2021-06-01: 1000 mL via INTRAVENOUS

## 2021-06-01 MED ORDER — CHLORDIAZEPOXIDE HCL 25 MG PO CAPS: ORAL_CAPSULE | ORAL | 0 refills | Status: DC

## 2021-06-01 MED ORDER — CHLORDIAZEPOXIDE HCL 25 MG PO CAPS: 25.0000 mg | ORAL_CAPSULE | Freq: Once | ORAL | Status: DC

## 2021-06-01 MED ORDER — LORAZEPAM 2 MG/ML IJ SOLN: 0.0000 mg | Freq: Two times a day (BID) | INTRAMUSCULAR | Status: DC

## 2021-06-01 MED ORDER — THIAMINE HCL 100 MG PO TABS: 100.0000 mg | ORAL_TABLET | Freq: Every day | ORAL | Status: DC

## 2021-06-01 MED ORDER — LORAZEPAM 1 MG PO TABS: 0.0000 mg | ORAL_TABLET | Freq: Four times a day (QID) | ORAL | Status: DC

## 2021-06-01 MED ORDER — LORAZEPAM 2 MG/ML IJ SOLN
2.0000 mg | Freq: Once | INTRAMUSCULAR | Status: AC
  Administered 2021-06-01: 2 mg via INTRAVENOUS
  Filled 2021-06-01: qty 1

## 2021-06-01 NOTE — ED Provider Notes (Signed)
[facility]  Marcus Wise , a 75 y.o. male  was evaluated in triage.  Pt complains of feeling bad and palpitations.  Pt reports he drank on Thursday, had vomiting Friday and today he feels bad and has some chest discomfort   Review of Systems  Positive: Chest tightness Negative: fever  Physical Exam  BP (!) 189/108 (BP Location: Left Arm)   Pulse 78   Temp 98.4 F (36.9 C) (Oral)   Resp 16   Ht 5' 9.5" (1.765 m)   Wt 75.3 kg   SpO2 98%   BMI 24.16 kg/m  Gen:   Awake, no distress   Resp:  Normal effort  MSK:   Moves extremities without difficulty  Other:    Medical Decision Making  Medically screening exam initiated at 8:49 PM.  Appropriate orders placed.  CRAY MONNIN was informed that the remainder of the evaluation will be completed by another provider, this initial triage assessment does not replace that evaluation, and the importance of remaining in the ED until their evaluation is complete.     Osie Cheeks 06/01/21 2050    Lorre Nick, MD 06/02/21 [phone]

## 2021-06-01 NOTE — ED Provider Notes (Addendum)
[facility]-EMERGENCY DEPT Provider Note   CSN: [id number] Arrival date & time: 06/01/21  1937     History Chief Complaint  Patient presents with   Palpitations   Shortness of Breath    Marcus Wise is a 75 y.o. male.  75 year old male who presents with likely alcohol withdrawal.  Has not drank in 2 days and is not having tremors.  Had sobriety for some time but then recently went on a bender.  Had emesis 2 days ago but that has since resolved.  Denies any abdominal pain.  Denies any other drug use.  Endorses tremors and feeling weak with a mild headache.  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)    ASD (atrial septal defect)     Past Surgical History:  Procedure Laterality Date   ATRIAL SEPTAL DEFECT(ASD) CLOSURE N/A 05/13/2018   Procedure: ATRIAL SEPTAL DEFECT (ASD) CLOSURE;  Surgeon: Tonny Bollman, MD;  Location: [facility] INVASIVE CV LAB;  Service: Cardiovascular;  Laterality: N/A;   BACK SURGERY     KNEE SURGERY     SPLENECTOMY         Family History  Problem Relation Age of Onset   Heart attack Father     Social History   Tobacco Use   Smoking status: Never   Smokeless tobacco: Never    Home Medications Prior to Admission medications   Medication Sig Start Date End Date Taking? Authorizing Provider  aspirin EC 81 MG tablet Take 1 tablet (81 mg total) by mouth daily. 04/30/18   Tonny Bollman, MD  Multiple Vitamin (MULTIVITAMIN) tablet Take 1 tablet by mouth daily.    [provider]  Omega-3 Fatty Acids (FISH OIL OMEGA-3 PO) Take 1 capsule  by mouth daily.    [provider]  ramipril (ALTACE) 10 MG capsule Take 10 mg by mouth daily.  09/16/14   [provider]    Allergies    Statins  Review of Systems   Review of Systems  All other systems reviewed and are negative.  Physical Exam Updated Vital Signs BP (!) 164/125   Pulse 99   Temp 98.4 F (36.9 C) (Oral)   Resp 16   Ht 1.765 m (5' 9.5")   Wt 75.3 kg   SpO2 95%   BMI 24.16 kg/m   Physical Exam Vitals and nursing note reviewed.  Constitutional:      General: He is not in acute distress.    Appearance: Normal appearance. He is well-developed. He is not toxic-appearing.  HENT:     Head: Normocephalic and atraumatic.  Eyes:     General: Lids are normal.     Conjunctiva/sclera: Conjunctivae normal.     Pupils: Pupils are equal, round, and reactive to light.  Neck:     Thyroid: No thyroid mass.     Trachea: No tracheal deviation.  Cardiovascular:     Rate and Rhythm: Normal rate and regular rhythm.     Heart sounds: Normal heart sounds. No murmur heard.   No  gallop.  Pulmonary:     Effort: Pulmonary effort is normal. No respiratory distress.     Breath sounds: Normal breath sounds. No stridor. No decreased breath sounds, wheezing, rhonchi or rales.  Abdominal:     General: There is no distension.     Palpations: Abdomen is soft.     Tenderness: There is no abdominal tenderness. There is no rebound.  Musculoskeletal:        General: No tenderness. Normal range of motion.     Cervical back: Normal range of motion and neck supple.  Skin:    General: Skin is warm and dry.     Findings: No abrasion or rash.  Neurological:     Mental Status: He is alert and oriented to person, place, and time. Mental status is at baseline.     GCS: GCS eye subscore is 4. GCS verbal subscore is 5. GCS motor subscore is 6.     Cranial Nerves: Cranial nerves are intact. No cranial nerve deficit.     Sensory: No sensory deficit.     Motor: Tremor present.   Psychiatric:        Attention and Perception: Attention normal.        Mood and Affect: Mood is anxious.        Speech: Speech normal.        Behavior: Behavior normal.    ED Results / Procedures / Treatments   Labs (all labs ordered are listed, but only abnormal results are displayed) Labs Reviewed  BASIC METABOLIC PANEL - Abnormal; Notable for the following components:      Result Value   Glucose, Bld 118 (*)    All other components within normal limits  CBC - Abnormal; Notable for the following components:   Platelets 448 (*)    All other components within normal limits  TROPONIN I (HIGH SENSITIVITY)  TROPONIN I (HIGH SENSITIVITY)    EKG EKG Interpretation  Date/Time:  Saturday June 01 2021 19:53:01 EDT Ventricular Rate:  94 PR Interval:  134 QRS Duration: 88 QT Interval:  366 QTC Calculation: 457 R Axis:   15 Text Interpretation: Sinus rhythm with marked sinus arrhythmia with occasional Premature ventricular complexes Otherwise normal ECG Confirmed by Lorre Nick ([id number]) on 06/01/2021 10:15:42 PM  Radiology DG Chest 2 View  Result Date: 06/01/2021 CLINICAL DATA:  75 year old male with shortness of breath. EXAM: CHEST - 2 VIEW COMPARISON:  Chest radiograph dated 02/24/2018. FINDINGS: No focal consolidation, pleural effusion, or pneumothorax. Septal defect Amplatzer occlusive device noted. The cardiac silhouette is within limits. No acute osseous pathology. Old healed left rib fracture deformities. IMPRESSION: No active cardiopulmonary disease. Electronically Signed   By: Elgie Collard M.D.   On: 06/01/2021 20:36    Procedures Procedures   Medications Ordered in ED Medications  lactated ringers infusion (has no administration in time range)  lactated ringers bolus 1,000 mL (has no administration in time range)  LORazepam (ATIVAN) injection 2 mg (has no administration in time range)  LORazepam (ATIVAN) injection 0-4 mg (has no administration in time range)    Or   LORazepam (ATIVAN) tablet 0-4 mg (has no administration in time range)  LORazepam (ATIVAN) injection 0-4 mg (has no administration in time range)    Or  LORazepam (ATIVAN) tablet 0-4 mg (has no administration in time range)  thiamine tablet 100 mg (has no administration in time range)    Or  thiamine (B-1) injection 100 mg (has no administration in time range)  ED Course  I have reviewed the triage vital signs and the nursing notes.  Pertinent labs & imaging results that were available during my care of the patient were reviewed by me and considered in my medical decision making (see chart for details).    MDM Rules/Calculators/A&P                          We will give patient IV hydration here as he states he has had decreased oral intake times several days.  Patient does appear to be going through some alcohol withdrawal.  Will get CIWA score and give Ativan.     11:35 PM Patient's initial CIWA score was 10 it is now down to 6 after 2 milligrams of Ativan.  Will give another milligram here and placed on Librium and discharge   CRITICAL CARE Performed by: Toy Baker Total critical care time: 55 minutes Critical care time was exclusive of separately billable procedures and treating other patients. Critical care was necessary to treat or prevent imminent or life-threatening deterioration. Critical care was time spent personally by me on the following activities: development of treatment plan with patient and/or surrogate as well as nursing, discussions with consultants, evaluation of patient's response to treatment, examination of patient, obtaining history from patient or surrogate, ordering and performing treatments and interventions, ordering and review of laboratory studies, ordering and review of radiographic studies, pulse oximetry and re-evaluation of patient's condition.  Final Clinical Impression(s) / ED Diagnoses Final diagnoses:  None    Rx / DC Orders ED  Discharge Orders     None        Lorre Nick, MD 06/01/21 2239    Lorre Nick, MD 06/01/21 2336

## 2021-06-01 NOTE — ED Triage Notes (Signed)
Pt reports palpitations, SHOB, and fatigue for a few days. Pt reports drinking liquor on Thursday after not drinking for a few weeks, and reports feeling like he is still hungover.

## 2021-06-05 DIAGNOSIS — Z6825 Body mass index (BMI) 25.0-25.9, adult: Secondary | ICD-10-CM | POA: Diagnosis not present

## 2021-06-05 DIAGNOSIS — I129 Hypertensive chronic kidney disease with stage 1 through stage 4 chronic kidney disease, or unspecified chronic kidney disease: Secondary | ICD-10-CM | POA: Diagnosis not present

## 2021-06-05 DIAGNOSIS — G4700 Insomnia, unspecified: Secondary | ICD-10-CM | POA: Diagnosis not present

## 2021-06-05 DIAGNOSIS — N183 Chronic kidney disease, stage 3 unspecified: Secondary | ICD-10-CM | POA: Diagnosis not present

## 2021-06-05 DIAGNOSIS — Z7289 Other problems related to lifestyle: Secondary | ICD-10-CM | POA: Diagnosis not present

## 2021-06-11 ENCOUNTER — Other Ambulatory Visit: Payer: Self-pay

## 2021-06-11 ENCOUNTER — Ambulatory Visit (INDEPENDENT_AMBULATORY_CARE_PROVIDER_SITE_OTHER): Payer: Medicare PPO | Admitting: Vascular Surgery

## 2021-06-11 DIAGNOSIS — I722 Aneurysm of renal artery: Secondary | ICD-10-CM | POA: Diagnosis not present

## 2021-06-11 NOTE — Progress Notes (Addendum)
I had to reschedule Marcus Wise evaluation because of a malfunction in our office plumbing.  We discussed his case for 10 minutes over the phone. The patient was at home. I was in my office. The patient provided consent to discuss this over the phone with me. 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  I counseled him that his risk of rupture from this is low.  The threshold for intervention is typically 2 cm.  He is understanding.  We will see him back in about 6 months with a CT scan of his abdomen pelvis with contrast to better evaluate the aneurysm and to plan a repair if it is needed.  We will of course check his renal function prior to this to ensure we are not hurting his solitary kidney.  Rande Brunt. Lenell Antu, MD [facility] Phone Number: [phone] 06/11/2021 3:41 PM

## 2021-06-18 DIAGNOSIS — F10930 Alcohol use, unspecified with withdrawal, uncomplicated: Secondary | ICD-10-CM | POA: Diagnosis not present

## 2021-06-18 DIAGNOSIS — Z79899 Other long term (current) drug therapy: Secondary | ICD-10-CM | POA: Diagnosis not present

## 2021-06-18 DIAGNOSIS — F10139 Alcohol abuse with withdrawal, unspecified: Secondary | ICD-10-CM | POA: Diagnosis not present

## 2021-06-20 DIAGNOSIS — R946 Abnormal results of thyroid function studies: Secondary | ICD-10-CM | POA: Diagnosis not present

## 2021-06-20 DIAGNOSIS — F1020 Alcohol dependence, uncomplicated: Secondary | ICD-10-CM | POA: Diagnosis not present

## 2021-06-20 DIAGNOSIS — I722 Aneurysm of renal artery: Secondary | ICD-10-CM | POA: Diagnosis not present

## 2021-06-20 DIAGNOSIS — N5314 Retrograde ejaculation: Secondary | ICD-10-CM | POA: Diagnosis not present

## 2021-06-20 DIAGNOSIS — Z79899 Other long term (current) drug therapy: Secondary | ICD-10-CM | POA: Diagnosis not present

## 2021-06-20 DIAGNOSIS — F10939 Alcohol use, unspecified with withdrawal, unspecified: Secondary | ICD-10-CM | POA: Diagnosis not present

## 2021-06-20 DIAGNOSIS — N401 Enlarged prostate with lower urinary tract symptoms: Secondary | ICD-10-CM | POA: Diagnosis not present

## 2021-06-20 DIAGNOSIS — N183 Chronic kidney disease, stage 3 unspecified: Secondary | ICD-10-CM | POA: Diagnosis not present

## 2021-06-20 DIAGNOSIS — Z7289 Other problems related to lifestyle: Secondary | ICD-10-CM | POA: Diagnosis not present

## 2021-06-20 DIAGNOSIS — I129 Hypertensive chronic kidney disease with stage 1 through stage 4 chronic kidney disease, or unspecified chronic kidney disease: Secondary | ICD-10-CM | POA: Diagnosis not present

## 2021-07-14 DIAGNOSIS — Z7722 Contact with and (suspected) exposure to environmental tobacco smoke (acute) (chronic): Secondary | ICD-10-CM | POA: Diagnosis not present

## 2021-07-14 DIAGNOSIS — I10 Essential (primary) hypertension: Secondary | ICD-10-CM | POA: Diagnosis not present

## 2021-07-14 DIAGNOSIS — N529 Male erectile dysfunction, unspecified: Secondary | ICD-10-CM | POA: Diagnosis not present

## 2021-07-14 DIAGNOSIS — G4700 Insomnia, unspecified: Secondary | ICD-10-CM | POA: Diagnosis not present

## 2021-07-14 DIAGNOSIS — M1990 Unspecified osteoarthritis, unspecified site: Secondary | ICD-10-CM | POA: Diagnosis not present

## 2021-07-14 DIAGNOSIS — Z8249 Family history of ischemic heart disease and other diseases of the circulatory system: Secondary | ICD-10-CM | POA: Diagnosis not present

## 2021-07-14 DIAGNOSIS — Z8673 Personal history of transient ischemic attack (TIA), and cerebral infarction without residual deficits: Secondary | ICD-10-CM | POA: Diagnosis not present

## 2021-07-14 DIAGNOSIS — Z809 Family history of malignant neoplasm, unspecified: Secondary | ICD-10-CM | POA: Diagnosis not present

## 2021-07-29 DIAGNOSIS — F10130 Alcohol abuse with withdrawal, uncomplicated: Secondary | ICD-10-CM | POA: Diagnosis not present

## 2021-07-29 DIAGNOSIS — I10 Essential (primary) hypertension: Secondary | ICD-10-CM | POA: Diagnosis not present

## 2021-07-29 DIAGNOSIS — F10239 Alcohol dependence with withdrawal, unspecified: Secondary | ICD-10-CM | POA: Diagnosis not present

## 2021-08-01 DIAGNOSIS — Z905 Acquired absence of kidney: Secondary | ICD-10-CM | POA: Diagnosis not present

## 2021-08-01 DIAGNOSIS — F10939 Alcohol use, unspecified with withdrawal, unspecified: Secondary | ICD-10-CM | POA: Diagnosis not present

## 2021-08-01 DIAGNOSIS — R7989 Other specified abnormal findings of blood chemistry: Secondary | ICD-10-CM | POA: Diagnosis not present

## 2021-08-01 DIAGNOSIS — N183 Chronic kidney disease, stage 3 unspecified: Secondary | ICD-10-CM | POA: Diagnosis not present

## 2021-08-01 DIAGNOSIS — I722 Aneurysm of renal artery: Secondary | ICD-10-CM | POA: Diagnosis not present

## 2021-08-01 DIAGNOSIS — E782 Mixed hyperlipidemia: Secondary | ICD-10-CM | POA: Diagnosis not present

## 2021-08-01 DIAGNOSIS — I129 Hypertensive chronic kidney disease with stage 1 through stage 4 chronic kidney disease, or unspecified chronic kidney disease: Secondary | ICD-10-CM | POA: Diagnosis not present

## 2021-08-01 DIAGNOSIS — Z524 Kidney donor: Secondary | ICD-10-CM | POA: Diagnosis not present

## 2021-08-05 DIAGNOSIS — N401 Enlarged prostate with lower urinary tract symptoms: Secondary | ICD-10-CM | POA: Diagnosis not present

## 2021-08-05 DIAGNOSIS — F1020 Alcohol dependence, uncomplicated: Secondary | ICD-10-CM | POA: Diagnosis not present

## 2021-08-05 DIAGNOSIS — N5314 Retrograde ejaculation: Secondary | ICD-10-CM | POA: Diagnosis not present

## 2021-08-15 DIAGNOSIS — N182 Chronic kidney disease, stage 2 (mild): Secondary | ICD-10-CM | POA: Diagnosis not present

## 2021-08-15 DIAGNOSIS — N2581 Secondary hyperparathyroidism of renal origin: Secondary | ICD-10-CM | POA: Diagnosis not present

## 2021-08-15 DIAGNOSIS — I129 Hypertensive chronic kidney disease with stage 1 through stage 4 chronic kidney disease, or unspecified chronic kidney disease: Secondary | ICD-10-CM | POA: Diagnosis not present

## 2021-08-15 DIAGNOSIS — Z524 Kidney donor: Secondary | ICD-10-CM | POA: Diagnosis not present

## 2021-08-15 DIAGNOSIS — I722 Aneurysm of renal artery: Secondary | ICD-10-CM | POA: Diagnosis not present

## 2021-08-15 DIAGNOSIS — Z8774 Personal history of (corrected) congenital malformations of heart and circulatory system: Secondary | ICD-10-CM | POA: Diagnosis not present

## 2021-08-15 DIAGNOSIS — D509 Iron deficiency anemia, unspecified: Secondary | ICD-10-CM | POA: Diagnosis not present

## 2021-08-15 DIAGNOSIS — M1990 Unspecified osteoarthritis, unspecified site: Secondary | ICD-10-CM | POA: Diagnosis not present

## 2021-08-16 DIAGNOSIS — Z8673 Personal history of transient ischemic attack (TIA), and cerebral infarction without residual deficits: Secondary | ICD-10-CM | POA: Diagnosis not present

## 2021-08-16 DIAGNOSIS — E209 Hypoparathyroidism, unspecified: Secondary | ICD-10-CM | POA: Diagnosis not present

## 2021-08-16 DIAGNOSIS — I6381 Other cerebral infarction due to occlusion or stenosis of small artery: Secondary | ICD-10-CM | POA: Diagnosis not present

## 2021-08-16 DIAGNOSIS — G9389 Other specified disorders of brain: Secondary | ICD-10-CM | POA: Diagnosis not present

## 2021-08-26 DIAGNOSIS — E0590 Thyrotoxicosis, unspecified without thyrotoxic crisis or storm: Secondary | ICD-10-CM | POA: Diagnosis not present

## 2021-08-26 DIAGNOSIS — E200 Idiopathic hypoparathyroidism: Secondary | ICD-10-CM | POA: Diagnosis not present

## 2021-10-04 DIAGNOSIS — E785 Hyperlipidemia, unspecified: Secondary | ICD-10-CM | POA: Diagnosis not present

## 2021-10-04 DIAGNOSIS — Z139 Encounter for screening, unspecified: Secondary | ICD-10-CM | POA: Diagnosis not present

## 2021-10-04 DIAGNOSIS — Z0000 Encounter for general adult medical examination without abnormal findings: Secondary | ICD-10-CM | POA: Diagnosis not present

## 2021-10-04 DIAGNOSIS — Z1331 Encounter for screening for depression: Secondary | ICD-10-CM | POA: Diagnosis not present

## 2021-10-04 DIAGNOSIS — Z9181 History of falling: Secondary | ICD-10-CM | POA: Diagnosis not present

## 2021-10-30 DIAGNOSIS — F1011 Alcohol abuse, in remission: Secondary | ICD-10-CM | POA: Diagnosis not present

## 2021-10-30 DIAGNOSIS — Z125 Encounter for screening for malignant neoplasm of prostate: Secondary | ICD-10-CM | POA: Diagnosis not present

## 2021-10-30 DIAGNOSIS — I129 Hypertensive chronic kidney disease with stage 1 through stage 4 chronic kidney disease, or unspecified chronic kidney disease: Secondary | ICD-10-CM | POA: Diagnosis not present

## 2021-10-30 DIAGNOSIS — I722 Aneurysm of renal artery: Secondary | ICD-10-CM | POA: Diagnosis not present

## 2021-10-30 DIAGNOSIS — Z524 Kidney donor: Secondary | ICD-10-CM | POA: Diagnosis not present

## 2021-10-30 DIAGNOSIS — E782 Mixed hyperlipidemia: Secondary | ICD-10-CM | POA: Diagnosis not present

## 2021-10-30 DIAGNOSIS — Z905 Acquired absence of kidney: Secondary | ICD-10-CM | POA: Diagnosis not present

## 2021-10-30 DIAGNOSIS — R7989 Other specified abnormal findings of blood chemistry: Secondary | ICD-10-CM | POA: Diagnosis not present

## 2021-10-30 DIAGNOSIS — N183 Chronic kidney disease, stage 3 unspecified: Secondary | ICD-10-CM | POA: Diagnosis not present

## 2021-11-15 DIAGNOSIS — Z7982 Long term (current) use of aspirin: Secondary | ICD-10-CM | POA: Diagnosis not present

## 2021-11-15 DIAGNOSIS — R059 Cough, unspecified: Secondary | ICD-10-CM | POA: Diagnosis not present

## 2021-11-15 DIAGNOSIS — U071 COVID-19: Secondary | ICD-10-CM | POA: Diagnosis not present

## 2021-11-15 DIAGNOSIS — R197 Diarrhea, unspecified: Secondary | ICD-10-CM | POA: Diagnosis not present

## 2021-11-15 DIAGNOSIS — R55 Syncope and collapse: Secondary | ICD-10-CM | POA: Diagnosis not present

## 2021-11-15 DIAGNOSIS — R0902 Hypoxemia: Secondary | ICD-10-CM | POA: Diagnosis not present

## 2021-11-15 DIAGNOSIS — R61 Generalized hyperhidrosis: Secondary | ICD-10-CM | POA: Diagnosis not present

## 2021-11-15 DIAGNOSIS — N179 Acute kidney failure, unspecified: Secondary | ICD-10-CM | POA: Diagnosis not present

## 2021-11-15 DIAGNOSIS — R42 Dizziness and giddiness: Secondary | ICD-10-CM | POA: Diagnosis not present

## 2021-11-15 DIAGNOSIS — I10 Essential (primary) hypertension: Secondary | ICD-10-CM | POA: Diagnosis not present

## 2021-11-15 DIAGNOSIS — E7800 Pure hypercholesterolemia, unspecified: Secondary | ICD-10-CM | POA: Diagnosis not present

## 2021-11-15 DIAGNOSIS — Z79899 Other long term (current) drug therapy: Secondary | ICD-10-CM | POA: Diagnosis not present

## 2021-11-15 DIAGNOSIS — Z8673 Personal history of transient ischemic attack (TIA), and cerebral infarction without residual deficits: Secondary | ICD-10-CM | POA: Diagnosis not present

## 2021-11-15 DIAGNOSIS — N289 Disorder of kidney and ureter, unspecified: Secondary | ICD-10-CM | POA: Diagnosis not present

## 2021-11-19 DIAGNOSIS — Z20828 Contact with and (suspected) exposure to other viral communicable diseases: Secondary | ICD-10-CM | POA: Diagnosis not present

## 2021-11-19 DIAGNOSIS — R0981 Nasal congestion: Secondary | ICD-10-CM | POA: Diagnosis not present

## 2021-11-27 DIAGNOSIS — R197 Diarrhea, unspecified: Secondary | ICD-10-CM | POA: Diagnosis not present

## 2021-12-01 DIAGNOSIS — F10930 Alcohol use, unspecified with withdrawal, uncomplicated: Secondary | ICD-10-CM | POA: Diagnosis not present

## 2021-12-01 DIAGNOSIS — I160 Hypertensive urgency: Secondary | ICD-10-CM | POA: Diagnosis not present

## 2021-12-01 DIAGNOSIS — I10 Essential (primary) hypertension: Secondary | ICD-10-CM | POA: Diagnosis not present

## 2021-12-01 DIAGNOSIS — F10130 Alcohol abuse with withdrawal, uncomplicated: Secondary | ICD-10-CM | POA: Diagnosis not present

## 2021-12-05 DIAGNOSIS — Z6826 Body mass index (BMI) 26.0-26.9, adult: Secondary | ICD-10-CM | POA: Diagnosis not present

## 2021-12-05 DIAGNOSIS — I722 Aneurysm of renal artery: Secondary | ICD-10-CM | POA: Diagnosis not present

## 2021-12-05 DIAGNOSIS — F1090 Alcohol use, unspecified, uncomplicated: Secondary | ICD-10-CM | POA: Diagnosis not present

## 2021-12-05 DIAGNOSIS — Z8616 Personal history of COVID-19: Secondary | ICD-10-CM | POA: Diagnosis not present

## 2021-12-05 DIAGNOSIS — I129 Hypertensive chronic kidney disease with stage 1 through stage 4 chronic kidney disease, or unspecified chronic kidney disease: Secondary | ICD-10-CM | POA: Diagnosis not present

## 2021-12-05 DIAGNOSIS — N183 Chronic kidney disease, stage 3 unspecified: Secondary | ICD-10-CM | POA: Diagnosis not present

## 2021-12-06 DIAGNOSIS — M1990 Unspecified osteoarthritis, unspecified site: Secondary | ICD-10-CM | POA: Diagnosis not present

## 2021-12-06 DIAGNOSIS — N182 Chronic kidney disease, stage 2 (mild): Secondary | ICD-10-CM | POA: Diagnosis not present

## 2021-12-06 DIAGNOSIS — Z8774 Personal history of (corrected) congenital malformations of heart and circulatory system: Secondary | ICD-10-CM | POA: Diagnosis not present

## 2021-12-06 DIAGNOSIS — I722 Aneurysm of renal artery: Secondary | ICD-10-CM | POA: Diagnosis not present

## 2021-12-06 DIAGNOSIS — D509 Iron deficiency anemia, unspecified: Secondary | ICD-10-CM | POA: Diagnosis not present

## 2021-12-06 DIAGNOSIS — N2581 Secondary hyperparathyroidism of renal origin: Secondary | ICD-10-CM | POA: Diagnosis not present

## 2021-12-06 DIAGNOSIS — I129 Hypertensive chronic kidney disease with stage 1 through stage 4 chronic kidney disease, or unspecified chronic kidney disease: Secondary | ICD-10-CM | POA: Diagnosis not present

## 2021-12-10 ENCOUNTER — Other Ambulatory Visit: Payer: Self-pay

## 2021-12-10 ENCOUNTER — Ambulatory Visit: Payer: Medicare PPO | Admitting: Physician Assistant

## 2021-12-10 VITALS — BP 167/99 | HR 72 | Temp 98.0°F | Resp 18 | Ht 69.0 in | Wt 186.2 lb

## 2021-12-10 DIAGNOSIS — I722 Aneurysm of renal artery: Secondary | ICD-10-CM | POA: Diagnosis not present

## 2021-12-10 NOTE — Progress Notes (Signed)
[facility] [place] HISTORY AND PHYSICAL   History of Present Illness:  Patient is a 76 y.o. 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 Arrie Aran.  He denies pain, he reports no urinary retention or voiding issues.  His Cr was reported 1.2 and he is followed on an out patient basis by Dr. Arrie Aran.    He is very active and is a runner for exercise.  He has just recovered from COVID over the holiday and plans to start running again.  He denies rest pain, claudication symptoms and non healing wounds.    Past medical history includes: CVA 2020, ASD and HTN.  He is on daily asa and ACEI.  He is not on a Statin his lipid panel show LDL 119.     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 No recent symptoms of stroke or mini- stroke. No recent episodes of slurred speech, or temporary blindness.  Cardiac: No recent episodes of chest pain/pressure, no shortness of breath at rest.  No shortness of breath with exertion.  Denies history of atrial fibrillation or irregular heartbeat  Vascular: No history of rest pain in feet.  No history of claudication.  No history of non-healing ulcer, No history of DVT   Pulmonary: No home oxygen, no productive  cough, no hemoptysis,  No asthma or wheezing  Musculoskeletal:  [ ]  Arthritis, [ ]  Low back pain,  [ ]  Joint pain  Hematologic:No history of hypercoagulable state.  No history of easy bleeding.  No history of anemia  Gastrointestinal: No hematochezia or melena,  No gastroesophageal reflux, no trouble swallowing  Urinary: [ x] chronic Kidney disease, [ ]  on HD - [ ]  MWF or [ ]  TTHS, [ ]  Burning with urination, [ ]  Frequent urination, [ ]  Difficulty urinating;   Skin: No rashes  Psychological: No history of anxiety,  No history of depression  Social History Social History   Tobacco Use   Smoking status: Never   Smokeless tobacco: Never    Family History Family History  Problem Relation Age of Onset   Heart attack Father     Allergies  Allergies  Allergen Reactions   Statins Other (See Comments)    Aching joints.     Current Outpatient Medications  Medication Sig Dispense Refill   aspirin EC 81 MG tablet Take 1 tablet (81 mg total) by mouth daily. 90 tablet 3   ramipril (ALTACE) 10 MG capsule Take 10 mg by mouth daily.      chlordiazePOXIDE (LIBRIUM) 25 MG capsule 50mg  PO TID x 1D, then 25-50mg  PO BID X 1D, then 25-50mg  PO QD X 1D (Patient not taking: Reported on 12/10/2021)  10 capsule 0   Multiple Vitamin (MULTIVITAMIN) tablet Take 1 tablet by mouth daily. (Patient not taking: Reported on 12/10/2021)     Omega-3 Fatty Acids (FISH OIL OMEGA-3 PO) Take 1 capsule by mouth daily. (Patient not taking: Reported on 12/10/2021)     No current facility-administered medications for this visit.    Physical Examination  Vitals:   12/10/21 1009  BP: (!) 167/99  Pulse: 72  Resp: 18  Temp: 98 F (36.7 C)  TempSrc: Temporal  SpO2: 96%  Weight: 186 lb 3.2 oz (84.5 kg)  Height: 5\' 9"  (1.753 m)    Body mass index is 27.5 kg/m.  General:  Alert and oriented, no acute distress HEENT: Normal Neck: No bruit or JVD Pulmonary: Clear to auscultation bilaterally Cardiac: Regular  Rate and Rhythm without murmur Abdomen: Soft, non-tender, non-distended, no mass, no scars Skin: No rash Extremity Pulses:  2+ radial, brachial, femoral, dorsalis pedis, posterior tibial pulses bilaterally Musculoskeletal: No deformity or edema  Neurologic: Upper and lower extremity motor 5/5 and symmetric  DATA:  FINDINGS: ARTERIAL   Aorta:                No aneurysm, dissection, or stenosis.   Celiac axis:          Widely patent   Superior mesenteric:  Widely patent.   Left renal:           Proximal ligation/occlusion.   Right renal: Single, widely patent proximally. 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   Inferior mesenteric:  Diminutive, patent   Left iliac:           Unremarkable   Right iliac: Unremarkable   VENOUS   No evident venous pathology on this arterial phase examination.   NONVASCULAR   Lower chest:  No acute findings.   Hepatobiliary: 1.3 cm probable cyst, lateral left hepatic segment. Otherwise unremarkable.   Pancreas: No mass, inflammatory changes, or other significant abnormality.   Spleen: Surgically absent   Adrenals/Urinary Tract: No masses identified. Absent left kidney. No evidence of hydronephrosis.   Stomach/Bowel: No evidence of obstruction, inflammatory process, or abnormal fluid collections. Sigmoid colon diverticula.   Lymphatic: No pathologically enlarged lymph nodes.   Other: No ascites.   Musculoskeletal: No suspicious bone lesions identified.   IMPRESSION: 1. Negative for evidence of hemodynamically significant right renal artery stenosis. 2. 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 Visceral aneurysms greater than 2 cm represent increased risk of rupture. Continued imaging surveillance recommended. 3. Sigmoid diverticulosis     Electronically Signed   By: Corlis Leak  Hassell M.D.   On: 04/16/2021 08:49   RENAL DUPLEX DOPPLER ULTRASOUND    COMPARISON:  None.   FINDINGS: Right Kidney:   Size: 13.1 x 6.9 x 6.8 cm, calculated volume is 320 mL. Echogenicity within normal limits. No mass or hydronephrosis visualized.   Left Kidney:   Surgically absent   Bladder: Small amount of fluid in the bladder. Prostate is enlarged with a prominent median lobe protruding at the bladder base. Prostate measures 6.9 x 5.1 x 5.8 cm for a calculated volume of 109 mL.   RENAL DUPLEX ULTRASOUND   Right Renal Artery Velocities:   Origin:  257 cm/sec   Mid:  194 cm/sec   Hilum:  190 cm/sec   Interlobar:  51 cm/sec   Arcuate:  31 cm/sec   Aortic Velocity:  76 cm/sec   Right Renal-Aortic  Ratios:   Origin: 3.4   Mid:  2.6   Hilum: 2.5   Interlobar: 0.7   Arcuate: 0.4   Other: Proximal abdominal aorta measures 2.7 cm. Mid abdominal aorta measures 2.1 cm. Distal abdominal aorta measures 1.9 cm. Right renal vein is patent.   IMPRESSION: 1. Evidence for right renal artery stenosis near the origin. Suspect the stenosis is close to 60% or greater based on the velocities and renal artery-aortic ratio. 2. Enlarged prostate. 3. Negative for right hydronephrosis. 4. Left nephrectomy.     Electronically Signed   By: Richarda Overlie M.D.   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   He is asymptomatic for urinary retention or voiding problems.  He has no symptoms of aortic aneurysm to palpation, dissection, or stenosis on MRI.  His popliteal pulses are not palpable, but he has easily palpable DP/PT pulses B LE.    He is active, eats well and requires HTN medication with asa daily.  BP today was elevated 167/99.   He states this is out of the ordinary and it is usually.      PLAN: I will schedule him for CTA Abd/Pelvis and he will f/u with Dr. Lenell Antu to review the study and plan future intervention verse watching.  The  threshold for intervention is typically 2 cm.  Activity as tolerates.   Mosetta Pigeon PA-C [facility] [place] Office: [phone]  MD in clinic [place]

## 2021-12-13 ENCOUNTER — Other Ambulatory Visit: Payer: Self-pay

## 2021-12-13 DIAGNOSIS — I722 Aneurysm of renal artery: Secondary | ICD-10-CM

## 2021-12-19 DIAGNOSIS — Z811 Family history of alcohol abuse and dependence: Secondary | ICD-10-CM | POA: Diagnosis not present

## 2021-12-19 DIAGNOSIS — Z888 Allergy status to other drugs, medicaments and biological substances status: Secondary | ICD-10-CM | POA: Diagnosis not present

## 2021-12-19 DIAGNOSIS — Z8249 Family history of ischemic heart disease and other diseases of the circulatory system: Secondary | ICD-10-CM | POA: Diagnosis not present

## 2021-12-19 DIAGNOSIS — F1021 Alcohol dependence, in remission: Secondary | ICD-10-CM | POA: Diagnosis not present

## 2021-12-19 DIAGNOSIS — Z604 Social exclusion and rejection: Secondary | ICD-10-CM | POA: Diagnosis not present

## 2021-12-19 DIAGNOSIS — Z8673 Personal history of transient ischemic attack (TIA), and cerebral infarction without residual deficits: Secondary | ICD-10-CM | POA: Diagnosis not present

## 2021-12-19 DIAGNOSIS — I10 Essential (primary) hypertension: Secondary | ICD-10-CM | POA: Diagnosis not present

## 2022-01-03 ENCOUNTER — Ambulatory Visit
Admission: RE | Admit: 2022-01-03 | Discharge: 2022-01-03 | Disposition: A | Discharge status: Home / Self Care | Payer: Medicare PPO | Source: Ambulatory Visit | Attending: Vascular Surgery | Admitting: Vascular Surgery

## 2022-01-03 DIAGNOSIS — K4020 Bilateral inguinal hernia, without obstruction or gangrene, not specified as recurrent: Secondary | ICD-10-CM | POA: Diagnosis not present

## 2022-01-03 DIAGNOSIS — I722 Aneurysm of renal artery: Secondary | ICD-10-CM

## 2022-01-03 DIAGNOSIS — N320 Bladder-neck obstruction: Secondary | ICD-10-CM | POA: Diagnosis not present

## 2022-01-03 DIAGNOSIS — N400 Enlarged prostate without lower urinary tract symptoms: Secondary | ICD-10-CM | POA: Diagnosis not present

## 2022-01-03 MED ORDER — IOPAMIDOL (ISOVUE-370) INJECTION 76%
75.0000 mL | Freq: Once | INTRAVENOUS | Status: AC | PRN
  Administered 2022-01-03: 75 mL via INTRAVENOUS

## 2022-01-07 ENCOUNTER — Ambulatory Visit: Payer: Medicare PPO | Admitting: Vascular Surgery

## 2022-01-13 NOTE — Progress Notes (Signed)
[facility] [place]  ASSESSMENT / PLAN: 76 y.o. male with 67mm solitary right renal artery aneurysm. This does not appear to be causing renal insufficiency (last Scr 1.0). He is taking one drug for hypertension.  I do not see any compelling reason to repair this aneurysm.  Current SVS guidelines recommend repair once aneurysm grows to 30 mm or more.  He has 2 consecutive studies which have demonstrated stability of the aneurysm.  I think his surveillance can be increased to every 2 to 3 years per SVS guidelines.  We will see him again in 2 years with a CT angiogram of the abdomen and pelvis.  He should continue aspirin 81 mg by mouth daily.  CHIEF COMPLAINT: renal artery aneurysm  HISTORY OF PRESENT ILLNESS: Marcus Wise is a 76 y.o. male with a solitary right kidney with a branch aneurysm.  This was identified on CT scan.  A CT angiogram was performed.  This confirms a 16 mm distal renal artery aneurysm at the branch point.  The patient has no urinary symptoms.  We reviewed the natural history of renal artery aneurysms and the current SVS guidelines.  Past Medical History:  Diagnosis Date   ASD (atrial septal defect)    CVA (cerebral vascular accident) ([place])    small right frontal    Hypertension     Past Surgical History:  Procedure Laterality Date   ATRIAL SEPTAL DEFECT(ASD) CLOSURE N/A 05/13/2018   Procedure: ATRIAL SEPTAL DEFECT (ASD) CLOSURE;  Surgeon: Sherren Mocha, MD;  Location: [place] CV LAB;  Service: Cardiovascular;  Laterality: N/A;   BACK SURGERY     KNEE SURGERY     SPLENECTOMY      Family History  Problem Relation Age of Onset   Heart attack Father     Social History   Socioeconomic History   Marital status: Widowed    Spouse name: Not on file   Number of children: Not on file   Years of education: Not on file   Highest education level: Not on file  Occupational History   Not on file  Tobacco Use   Smoking status: Never    Smokeless tobacco: Never  Substance and Sexual Activity   Alcohol use: Not on file   Drug use: Not on file   Sexual activity: Not on file  Other Topics Concern   Not on file  Social History Narrative   Not on file   Social Determinants of Health   Financial Resource Strain: Not on file  Food Insecurity: Not on file  Transportation Needs: Not on file  Physical Activity: Not on file  Stress: Not on file  Social Connections: Not on file  Intimate Partner Violence: Not on file    Allergies  Allergen Reactions   Statins Other (See Comments)    Aching joints.    Current Outpatient Medications  Medication Sig Dispense Refill   aspirin EC 81 MG tablet Take 1 tablet (81 mg total) by mouth daily. 90 tablet 3   chlordiazePOXIDE (LIBRIUM) 25 MG capsule 50mg  PO TID x 1D, then 25-50mg  PO BID X 1D, then 25-50mg  PO QD X 1D (Patient not taking: Reported on 12/10/2021) 10 capsule 0   Multiple Vitamin (MULTIVITAMIN) tablet Take 1 tablet by mouth daily. (Patient not taking: Reported on 12/10/2021)     Omega-3 Fatty Acids (FISH OIL OMEGA-3 PO) Take 1 capsule by mouth daily. (Patient not taking: Reported on 12/10/2021)     ramipril (ALTACE) 10  MG capsule Take 10 mg by mouth daily.      No current facility-administered medications for this visit.    PHYSICAL EXAM Vitals:   01/14/22 1415  BP: 119/82  Pulse: 89  Resp: 20  Temp: 98.3 F (36.8 C)  SpO2: 97%  Weight: 176 lb (79.8 kg)  Height: 5\' 9"  (1.753 m)    Constitutional: well appearing. no distress. Appears well nourished.  Neurologic: CN intact. no focal findings. no sensory loss. Psychiatric:  Mood and affect symmetric and appropriate. Eyes:  No icterus. No conjunctival pallor. Ears, nose, throat:  mucous membranes moist. Midline trachea.  Cardiac: regular rate and rhythm.  Respiratory:  unlabored. Abdominal:  soft, non-tender, non-distended.  Peripheral vascular: 2+ DP pulses Extremity: no edema. no cyanosis. no pallor.  Skin:  no gangrene. no ulceration.  Lymphatic: no Stemmer's sign. no palpable lymphadenopathy.  PERTINENT LABORATORY AND RADIOLOGIC DATA  Most recent CBC CBC Latest Ref Rng & Units 06/01/2021 04/30/2018  WBC 4.0 - 10.5 K/uL 7.7 6.9  Hemoglobin 13.0 - 17.0 g/dL 16.4 15.0  Hematocrit 39.0 - 52.0 % 46.3 43.9  Platelets 150 - 400 K/uL 448(H) 371     Most recent CMP CMP Latest Ref Rng & Units 06/01/2021 11/03/2018 05/13/2018  Glucose 70 - 99 mg/dL 118(H) - 102(H)  BUN 8 - 23 mg/dL 10 - 21(H)  Creatinine 0.61 - 1.24 mg/dL 1.01 - 1.22  Sodium 135 - 145 mmol/L 138 - 139  Potassium 3.5 - 5.1 mmol/L 3.7 - 3.7  Chloride 98 - 111 mmol/L 103 - 108  CO2 22 - 32 mmol/L 24 - 24  Calcium 8.9 - 10.3 mg/dL 9.7 - 9.5  Total Protein 6.0 - 8.5 g/dL - 6.9 -  Total Bilirubin 0.0 - 1.2 mg/dL - 0.4 -  Alkaline Phos 39 - 117 IU/L - 73 -  AST 0 - 40 IU/L - 48(H) -  ALT 0 - 44 IU/L - 53(H) -   CT angiogram of the abdomen / pelvis 01/03/22. Personally reviewed in detail.  There is a solitary right kidney.  Left kidney is surgically absent.  There is a 16 mm aneurysm at the division of the main renal artery which would be technically challenging to repair endovascularly.  Marcus Wise. Stanford Breed, MD [facility] Phone Number: [phone] 01/13/2022 7:27 PM  Total time spent on preparing this encounter including chart review, data review, collecting history, examining the patient, coordinating care for this established patient, 40 minutes.  Portions of this report may have been transcribed using voice recognition software.  Every effort has been made to ensure accuracy; however, inadvertent computerized transcription errors may still be present.

## 2022-01-14 ENCOUNTER — Encounter: Payer: Self-pay | Admitting: Vascular Surgery

## 2022-01-14 ENCOUNTER — Ambulatory Visit: Payer: Medicare PPO | Admitting: Vascular Surgery

## 2022-01-14 ENCOUNTER — Other Ambulatory Visit: Payer: Self-pay

## 2022-01-14 VITALS — BP 119/82 | HR 89 | Temp 98.3°F | Resp 20 | Ht 69.0 in | Wt 176.0 lb

## 2022-01-14 DIAGNOSIS — I722 Aneurysm of renal artery: Secondary | ICD-10-CM | POA: Diagnosis not present

## 2022-01-23 DIAGNOSIS — M19012 Primary osteoarthritis, left shoulder: Secondary | ICD-10-CM | POA: Diagnosis not present

## 2022-01-31 DIAGNOSIS — F1090 Alcohol use, unspecified, uncomplicated: Secondary | ICD-10-CM | POA: Diagnosis not present

## 2022-01-31 DIAGNOSIS — I129 Hypertensive chronic kidney disease with stage 1 through stage 4 chronic kidney disease, or unspecified chronic kidney disease: Secondary | ICD-10-CM | POA: Diagnosis not present

## 2022-01-31 DIAGNOSIS — Z8616 Personal history of COVID-19: Secondary | ICD-10-CM | POA: Diagnosis not present

## 2022-01-31 DIAGNOSIS — I722 Aneurysm of renal artery: Secondary | ICD-10-CM | POA: Diagnosis not present

## 2022-01-31 DIAGNOSIS — Z6826 Body mass index (BMI) 26.0-26.9, adult: Secondary | ICD-10-CM | POA: Diagnosis not present

## 2022-01-31 DIAGNOSIS — N183 Chronic kidney disease, stage 3 unspecified: Secondary | ICD-10-CM | POA: Diagnosis not present

## 2022-02-03 DIAGNOSIS — N5314 Retrograde ejaculation: Secondary | ICD-10-CM | POA: Diagnosis not present

## 2022-02-03 DIAGNOSIS — N401 Enlarged prostate with lower urinary tract symptoms: Secondary | ICD-10-CM | POA: Diagnosis not present

## 2022-03-11 DIAGNOSIS — J069 Acute upper respiratory infection, unspecified: Secondary | ICD-10-CM | POA: Diagnosis not present

## 2022-03-11 DIAGNOSIS — R1084 Generalized abdominal pain: Secondary | ICD-10-CM | POA: Diagnosis not present

## 2022-03-12 DIAGNOSIS — K921 Melena: Secondary | ICD-10-CM | POA: Diagnosis not present

## 2022-03-12 DIAGNOSIS — J302 Other seasonal allergic rhinitis: Secondary | ICD-10-CM | POA: Diagnosis not present

## 2022-03-12 DIAGNOSIS — Z6826 Body mass index (BMI) 26.0-26.9, adult: Secondary | ICD-10-CM | POA: Diagnosis not present

## 2022-03-31 DIAGNOSIS — F1020 Alcohol dependence, uncomplicated: Secondary | ICD-10-CM | POA: Diagnosis not present

## 2022-04-08 DIAGNOSIS — I722 Aneurysm of renal artery: Secondary | ICD-10-CM | POA: Diagnosis not present

## 2022-04-08 DIAGNOSIS — D509 Iron deficiency anemia, unspecified: Secondary | ICD-10-CM | POA: Diagnosis not present

## 2022-04-08 DIAGNOSIS — N2581 Secondary hyperparathyroidism of renal origin: Secondary | ICD-10-CM | POA: Diagnosis not present

## 2022-04-08 DIAGNOSIS — N182 Chronic kidney disease, stage 2 (mild): Secondary | ICD-10-CM | POA: Diagnosis not present

## 2022-04-08 DIAGNOSIS — Z8774 Personal history of (corrected) congenital malformations of heart and circulatory system: Secondary | ICD-10-CM | POA: Diagnosis not present

## 2022-04-08 DIAGNOSIS — I129 Hypertensive chronic kidney disease with stage 1 through stage 4 chronic kidney disease, or unspecified chronic kidney disease: Secondary | ICD-10-CM | POA: Diagnosis not present

## 2022-04-08 DIAGNOSIS — M1990 Unspecified osteoarthritis, unspecified site: Secondary | ICD-10-CM | POA: Diagnosis not present

## 2022-05-02 DIAGNOSIS — N183 Chronic kidney disease, stage 3 unspecified: Secondary | ICD-10-CM | POA: Diagnosis not present

## 2022-05-02 DIAGNOSIS — E782 Mixed hyperlipidemia: Secondary | ICD-10-CM | POA: Diagnosis not present

## 2022-05-02 DIAGNOSIS — Z8616 Personal history of COVID-19: Secondary | ICD-10-CM | POA: Diagnosis not present

## 2022-05-02 DIAGNOSIS — Z6826 Body mass index (BMI) 26.0-26.9, adult: Secondary | ICD-10-CM | POA: Diagnosis not present

## 2022-05-02 DIAGNOSIS — I129 Hypertensive chronic kidney disease with stage 1 through stage 4 chronic kidney disease, or unspecified chronic kidney disease: Secondary | ICD-10-CM | POA: Diagnosis not present

## 2022-05-02 DIAGNOSIS — I722 Aneurysm of renal artery: Secondary | ICD-10-CM | POA: Diagnosis not present

## 2022-05-17 DIAGNOSIS — Z8673 Personal history of transient ischemic attack (TIA), and cerebral infarction without residual deficits: Secondary | ICD-10-CM | POA: Diagnosis not present

## 2022-05-17 DIAGNOSIS — Z7982 Long term (current) use of aspirin: Secondary | ICD-10-CM | POA: Diagnosis not present

## 2022-05-17 DIAGNOSIS — F10930 Alcohol use, unspecified with withdrawal, uncomplicated: Secondary | ICD-10-CM | POA: Diagnosis not present

## 2022-05-17 DIAGNOSIS — I10 Essential (primary) hypertension: Secondary | ICD-10-CM | POA: Diagnosis not present

## 2022-05-17 DIAGNOSIS — Z79899 Other long term (current) drug therapy: Secondary | ICD-10-CM | POA: Diagnosis not present

## 2022-06-10 DIAGNOSIS — Z6826 Body mass index (BMI) 26.0-26.9, adult: Secondary | ICD-10-CM | POA: Diagnosis not present

## 2022-06-10 DIAGNOSIS — S0990XA Unspecified injury of head, initial encounter: Secondary | ICD-10-CM | POA: Diagnosis not present

## 2022-06-11 DIAGNOSIS — S0990XA Unspecified injury of head, initial encounter: Secondary | ICD-10-CM | POA: Diagnosis not present

## 2022-06-11 DIAGNOSIS — S0003XA Contusion of scalp, initial encounter: Secondary | ICD-10-CM | POA: Diagnosis not present

## 2022-06-18 DIAGNOSIS — Z6827 Body mass index (BMI) 27.0-27.9, adult: Secondary | ICD-10-CM | POA: Diagnosis not present

## 2022-06-18 DIAGNOSIS — D539 Nutritional anemia, unspecified: Secondary | ICD-10-CM | POA: Diagnosis not present

## 2022-06-18 DIAGNOSIS — R252 Cramp and spasm: Secondary | ICD-10-CM | POA: Diagnosis not present

## 2022-07-13 DIAGNOSIS — R531 Weakness: Secondary | ICD-10-CM | POA: Diagnosis not present

## 2022-07-13 DIAGNOSIS — Z8673 Personal history of transient ischemic attack (TIA), and cerebral infarction without residual deficits: Secondary | ICD-10-CM | POA: Diagnosis not present

## 2022-07-13 DIAGNOSIS — R5383 Other fatigue: Secondary | ICD-10-CM | POA: Diagnosis not present

## 2022-07-13 DIAGNOSIS — Z20822 Contact with and (suspected) exposure to covid-19: Secondary | ICD-10-CM | POA: Diagnosis not present

## 2022-07-16 DIAGNOSIS — I129 Hypertensive chronic kidney disease with stage 1 through stage 4 chronic kidney disease, or unspecified chronic kidney disease: Secondary | ICD-10-CM | POA: Diagnosis not present

## 2022-07-16 DIAGNOSIS — N183 Chronic kidney disease, stage 3 unspecified: Secondary | ICD-10-CM | POA: Diagnosis not present

## 2022-07-16 DIAGNOSIS — Z6827 Body mass index (BMI) 27.0-27.9, adult: Secondary | ICD-10-CM | POA: Diagnosis not present

## 2022-08-05 DIAGNOSIS — I129 Hypertensive chronic kidney disease with stage 1 through stage 4 chronic kidney disease, or unspecified chronic kidney disease: Secondary | ICD-10-CM | POA: Diagnosis not present

## 2022-08-05 DIAGNOSIS — N183 Chronic kidney disease, stage 3 unspecified: Secondary | ICD-10-CM | POA: Diagnosis not present

## 2022-08-05 DIAGNOSIS — Z6827 Body mass index (BMI) 27.0-27.9, adult: Secondary | ICD-10-CM | POA: Diagnosis not present

## 2022-09-01 DIAGNOSIS — Z6827 Body mass index (BMI) 27.0-27.9, adult: Secondary | ICD-10-CM | POA: Diagnosis not present

## 2022-09-01 DIAGNOSIS — I129 Hypertensive chronic kidney disease with stage 1 through stage 4 chronic kidney disease, or unspecified chronic kidney disease: Secondary | ICD-10-CM | POA: Diagnosis not present

## 2022-09-01 DIAGNOSIS — N183 Chronic kidney disease, stage 3 unspecified: Secondary | ICD-10-CM | POA: Diagnosis not present

## 2022-10-15 DIAGNOSIS — K591 Functional diarrhea: Secondary | ICD-10-CM | POA: Diagnosis not present

## 2022-10-15 DIAGNOSIS — I10 Essential (primary) hypertension: Secondary | ICD-10-CM | POA: Diagnosis not present

## 2022-10-15 DIAGNOSIS — R509 Fever, unspecified: Secondary | ICD-10-CM | POA: Diagnosis not present

## 2022-11-11 DIAGNOSIS — H1013 Acute atopic conjunctivitis, bilateral: Secondary | ICD-10-CM | POA: Diagnosis not present

## 2022-11-26 DIAGNOSIS — J329 Chronic sinusitis, unspecified: Secondary | ICD-10-CM | POA: Diagnosis not present

## 2022-11-26 DIAGNOSIS — Z6827 Body mass index (BMI) 27.0-27.9, adult: Secondary | ICD-10-CM | POA: Diagnosis not present

## 2022-12-15 DIAGNOSIS — R7401 Elevation of levels of liver transaminase levels: Secondary | ICD-10-CM | POA: Diagnosis not present

## 2022-12-15 DIAGNOSIS — I639 Cerebral infarction, unspecified: Secondary | ICD-10-CM | POA: Diagnosis not present

## 2022-12-15 DIAGNOSIS — I82622 Acute embolism and thrombosis of deep veins of left upper extremity: Secondary | ICD-10-CM | POA: Diagnosis not present

## 2022-12-15 DIAGNOSIS — I4891 Unspecified atrial fibrillation: Secondary | ICD-10-CM | POA: Diagnosis not present

## 2022-12-15 DIAGNOSIS — E7800 Pure hypercholesterolemia, unspecified: Secondary | ICD-10-CM | POA: Diagnosis not present

## 2022-12-15 DIAGNOSIS — E860 Dehydration: Secondary | ICD-10-CM | POA: Diagnosis not present

## 2022-12-15 DIAGNOSIS — E876 Hypokalemia: Secondary | ICD-10-CM | POA: Diagnosis not present

## 2022-12-15 DIAGNOSIS — K760 Fatty (change of) liver, not elsewhere classified: Secondary | ICD-10-CM | POA: Diagnosis not present

## 2022-12-15 DIAGNOSIS — K5730 Diverticulosis of large intestine without perforation or abscess without bleeding: Secondary | ICD-10-CM | POA: Diagnosis not present

## 2022-12-15 DIAGNOSIS — I808 Phlebitis and thrombophlebitis of other sites: Secondary | ICD-10-CM | POA: Diagnosis not present

## 2022-12-15 DIAGNOSIS — F10939 Alcohol use, unspecified with withdrawal, unspecified: Secondary | ICD-10-CM | POA: Diagnosis not present

## 2022-12-15 DIAGNOSIS — R9431 Abnormal electrocardiogram [ECG] [EKG]: Secondary | ICD-10-CM | POA: Diagnosis not present

## 2022-12-15 DIAGNOSIS — R600 Localized edema: Secondary | ICD-10-CM | POA: Diagnosis not present

## 2022-12-15 DIAGNOSIS — F10239 Alcohol dependence with withdrawal, unspecified: Secondary | ICD-10-CM | POA: Diagnosis not present

## 2022-12-15 DIAGNOSIS — I10 Essential (primary) hypertension: Secondary | ICD-10-CM | POA: Diagnosis not present

## 2022-12-15 DIAGNOSIS — E8729 Other acidosis: Secondary | ICD-10-CM | POA: Diagnosis not present

## 2022-12-15 DIAGNOSIS — G928 Other toxic encephalopathy: Secondary | ICD-10-CM | POA: Diagnosis not present

## 2022-12-17 DIAGNOSIS — I4891 Unspecified atrial fibrillation: Secondary | ICD-10-CM

## 2022-12-25 DIAGNOSIS — F32A Depression, unspecified: Secondary | ICD-10-CM | POA: Diagnosis not present

## 2022-12-25 DIAGNOSIS — I700 Atherosclerosis of aorta: Secondary | ICD-10-CM | POA: Diagnosis not present

## 2022-12-25 DIAGNOSIS — I480 Paroxysmal atrial fibrillation: Secondary | ICD-10-CM | POA: Diagnosis not present

## 2022-12-25 DIAGNOSIS — K5730 Diverticulosis of large intestine without perforation or abscess without bleeding: Secondary | ICD-10-CM | POA: Diagnosis not present

## 2022-12-25 DIAGNOSIS — I10 Essential (primary) hypertension: Secondary | ICD-10-CM | POA: Diagnosis not present

## 2022-12-25 DIAGNOSIS — F10239 Alcohol dependence with withdrawal, unspecified: Secondary | ICD-10-CM | POA: Diagnosis not present

## 2022-12-25 DIAGNOSIS — L03114 Cellulitis of left upper limb: Secondary | ICD-10-CM | POA: Diagnosis not present

## 2022-12-25 DIAGNOSIS — I160 Hypertensive urgency: Secondary | ICD-10-CM | POA: Diagnosis not present

## 2022-12-25 DIAGNOSIS — I82622 Acute embolism and thrombosis of deep veins of left upper extremity: Secondary | ICD-10-CM | POA: Diagnosis not present

## 2022-12-26 DIAGNOSIS — I480 Paroxysmal atrial fibrillation: Secondary | ICD-10-CM | POA: Diagnosis not present

## 2022-12-26 DIAGNOSIS — F10239 Alcohol dependence with withdrawal, unspecified: Secondary | ICD-10-CM | POA: Diagnosis not present

## 2022-12-26 DIAGNOSIS — I160 Hypertensive urgency: Secondary | ICD-10-CM | POA: Diagnosis not present

## 2022-12-26 DIAGNOSIS — I700 Atherosclerosis of aorta: Secondary | ICD-10-CM | POA: Diagnosis not present

## 2022-12-26 DIAGNOSIS — K5730 Diverticulosis of large intestine without perforation or abscess without bleeding: Secondary | ICD-10-CM | POA: Diagnosis not present

## 2022-12-26 DIAGNOSIS — I10 Essential (primary) hypertension: Secondary | ICD-10-CM | POA: Diagnosis not present

## 2022-12-26 DIAGNOSIS — I82622 Acute embolism and thrombosis of deep veins of left upper extremity: Secondary | ICD-10-CM | POA: Diagnosis not present

## 2022-12-26 DIAGNOSIS — F32A Depression, unspecified: Secondary | ICD-10-CM | POA: Diagnosis not present

## 2022-12-26 DIAGNOSIS — L03114 Cellulitis of left upper limb: Secondary | ICD-10-CM | POA: Diagnosis not present

## 2022-12-30 DIAGNOSIS — I82629 Acute embolism and thrombosis of deep veins of unspecified upper extremity: Secondary | ICD-10-CM | POA: Diagnosis not present

## 2022-12-30 DIAGNOSIS — Z6827 Body mass index (BMI) 27.0-27.9, adult: Secondary | ICD-10-CM | POA: Diagnosis not present

## 2022-12-30 DIAGNOSIS — I129 Hypertensive chronic kidney disease with stage 1 through stage 4 chronic kidney disease, or unspecified chronic kidney disease: Secondary | ICD-10-CM | POA: Diagnosis not present

## 2022-12-30 DIAGNOSIS — R748 Abnormal levels of other serum enzymes: Secondary | ICD-10-CM | POA: Diagnosis not present

## 2022-12-30 DIAGNOSIS — N183 Chronic kidney disease, stage 3 unspecified: Secondary | ICD-10-CM | POA: Diagnosis not present

## 2022-12-30 DIAGNOSIS — F1020 Alcohol dependence, uncomplicated: Secondary | ICD-10-CM | POA: Diagnosis not present

## 2022-12-30 DIAGNOSIS — D649 Anemia, unspecified: Secondary | ICD-10-CM | POA: Diagnosis not present

## 2023-01-01 ENCOUNTER — Encounter: Payer: Self-pay | Admitting: Oncology

## 2023-01-01 ENCOUNTER — Inpatient Hospital Stay: Payer: Medicare PPO

## 2023-01-01 ENCOUNTER — Telehealth: Payer: Self-pay | Admitting: Oncology

## 2023-01-01 ENCOUNTER — Inpatient Hospital Stay: Payer: Medicare PPO | Attending: Oncology | Admitting: Oncology

## 2023-01-01 VITALS — BP 132/85 | HR 63 | Temp 98.5°F | Resp 16 | Ht 69.5 in | Wt 178.4 lb

## 2023-01-01 DIAGNOSIS — N183 Chronic kidney disease, stage 3 unspecified: Secondary | ICD-10-CM | POA: Insufficient documentation

## 2023-01-01 DIAGNOSIS — L03114 Cellulitis of left upper limb: Secondary | ICD-10-CM | POA: Diagnosis not present

## 2023-01-01 DIAGNOSIS — Z905 Acquired absence of kidney: Secondary | ICD-10-CM | POA: Insufficient documentation

## 2023-01-01 DIAGNOSIS — F10939 Alcohol use, unspecified with withdrawal, unspecified: Secondary | ICD-10-CM | POA: Insufficient documentation

## 2023-01-01 DIAGNOSIS — Z9081 Acquired absence of spleen: Secondary | ICD-10-CM | POA: Insufficient documentation

## 2023-01-01 DIAGNOSIS — I700 Atherosclerosis of aorta: Secondary | ICD-10-CM | POA: Diagnosis not present

## 2023-01-01 DIAGNOSIS — D75838 Other thrombocytosis: Secondary | ICD-10-CM | POA: Diagnosis not present

## 2023-01-01 DIAGNOSIS — Z79899 Other long term (current) drug therapy: Secondary | ICD-10-CM | POA: Diagnosis not present

## 2023-01-01 DIAGNOSIS — K5730 Diverticulosis of large intestine without perforation or abscess without bleeding: Secondary | ICD-10-CM | POA: Diagnosis not present

## 2023-01-01 DIAGNOSIS — I82622 Acute embolism and thrombosis of deep veins of left upper extremity: Secondary | ICD-10-CM

## 2023-01-01 DIAGNOSIS — D75839 Thrombocytosis, unspecified: Secondary | ICD-10-CM

## 2023-01-01 DIAGNOSIS — F10239 Alcohol dependence with withdrawal, unspecified: Secondary | ICD-10-CM | POA: Diagnosis not present

## 2023-01-01 DIAGNOSIS — Z7901 Long term (current) use of anticoagulants: Secondary | ICD-10-CM | POA: Insufficient documentation

## 2023-01-01 DIAGNOSIS — D7589 Other specified diseases of blood and blood-forming organs: Secondary | ICD-10-CM | POA: Diagnosis not present

## 2023-01-01 DIAGNOSIS — I10 Essential (primary) hypertension: Secondary | ICD-10-CM | POA: Diagnosis not present

## 2023-01-01 DIAGNOSIS — I160 Hypertensive urgency: Secondary | ICD-10-CM | POA: Diagnosis not present

## 2023-01-01 DIAGNOSIS — I480 Paroxysmal atrial fibrillation: Secondary | ICD-10-CM | POA: Diagnosis not present

## 2023-01-01 DIAGNOSIS — F32A Depression, unspecified: Secondary | ICD-10-CM | POA: Diagnosis not present

## 2023-01-01 LAB — RETICULOCYTES
Immature Retic Fract: 12.2 % (ref 2.3–15.9)
RBC.: 4.24 MIL/uL (ref 4.22–5.81)
Retic Count, Absolute: 89.5 10*3/uL (ref 19.0–186.0)
Retic Ct Pct: 2.1 % (ref 0.4–3.1)

## 2023-01-01 LAB — TECHNOLOGIST SMEAR REVIEW

## 2023-01-01 LAB — VITAMIN B12: Vitamin B-12: 601 pg/mL (ref 180–914)

## 2023-01-01 LAB — FERRITIN: Ferritin: 619 ng/mL — ABNORMAL HIGH (ref 24–336)

## 2023-01-01 LAB — DIRECT ANTIGLOBULIN TEST (NOT AT ARMC)
DAT, IgG: NEGATIVE
DAT, complement: NEGATIVE

## 2023-01-01 LAB — FOLATE: Folate: 16.5 ng/mL (ref >5.9)
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
## 2023-01-01 NOTE — Progress Notes (Unsigned)
  Transaction ID: [id number] Customer ID: [id number]

## 2023-01-01 NOTE — Telephone Encounter (Signed)
Patient has been scheduled for follow-up visit per 01/01/23 LOS.  Pt given an appt calendar with date and time.  

## 2023-01-02 ENCOUNTER — Other Ambulatory Visit: Payer: Self-pay | Admitting: Oncology

## 2023-01-02 LAB — HAPTOGLOBIN: Haptoglobin: 291 mg/dL (ref 34–355)

## 2023-01-05 LAB — BCR-ABL1 FISH
Cells Analyzed: 200
Cells Counted: 200

## 2023-01-06 DIAGNOSIS — E782 Mixed hyperlipidemia: Secondary | ICD-10-CM | POA: Diagnosis not present

## 2023-01-06 DIAGNOSIS — I129 Hypertensive chronic kidney disease with stage 1 through stage 4 chronic kidney disease, or unspecified chronic kidney disease: Secondary | ICD-10-CM | POA: Diagnosis not present

## 2023-01-07 DIAGNOSIS — L03114 Cellulitis of left upper limb: Secondary | ICD-10-CM | POA: Diagnosis not present

## 2023-01-07 DIAGNOSIS — I160 Hypertensive urgency: Secondary | ICD-10-CM | POA: Diagnosis not present

## 2023-01-07 DIAGNOSIS — I10 Essential (primary) hypertension: Secondary | ICD-10-CM | POA: Diagnosis not present

## 2023-01-07 DIAGNOSIS — F10239 Alcohol dependence with withdrawal, unspecified: Secondary | ICD-10-CM | POA: Diagnosis not present

## 2023-01-07 DIAGNOSIS — K5730 Diverticulosis of large intestine without perforation or abscess without bleeding: Secondary | ICD-10-CM | POA: Diagnosis not present

## 2023-01-07 DIAGNOSIS — I82622 Acute embolism and thrombosis of deep veins of left upper extremity: Secondary | ICD-10-CM | POA: Diagnosis not present

## 2023-01-07 DIAGNOSIS — F32A Depression, unspecified: Secondary | ICD-10-CM | POA: Diagnosis not present

## 2023-01-07 DIAGNOSIS — I700 Atherosclerosis of aorta: Secondary | ICD-10-CM | POA: Diagnosis not present

## 2023-01-07 DIAGNOSIS — I480 Paroxysmal atrial fibrillation: Secondary | ICD-10-CM | POA: Diagnosis not present

## 2023-01-08 DIAGNOSIS — F1020 Alcohol dependence, uncomplicated: Secondary | ICD-10-CM | POA: Diagnosis not present

## 2023-01-08 DIAGNOSIS — N401 Enlarged prostate with lower urinary tract symptoms: Secondary | ICD-10-CM | POA: Diagnosis not present

## 2023-01-08 DIAGNOSIS — N5314 Retrograde ejaculation: Secondary | ICD-10-CM | POA: Diagnosis not present

## 2023-01-08 DIAGNOSIS — Z125 Encounter for screening for malignant neoplasm of prostate: Secondary | ICD-10-CM | POA: Diagnosis not present

## 2023-01-11 LAB — CALR + MPL

## 2023-01-11 LAB — JAK2 V617F, REFLEX TO CALR/MPL

## 2023-01-14 ENCOUNTER — Inpatient Hospital Stay: Payer: Medicare PPO | Admitting: Oncology

## 2023-01-14 ENCOUNTER — Inpatient Hospital Stay: Payer: Medicare PPO

## 2023-01-14 VITALS — BP 128/72 | HR 66 | Temp 97.7°F | Resp 18 | Ht 69.5 in | Wt 181.2 lb

## 2023-01-14 DIAGNOSIS — R7889 Finding of other specified substances, not normally found in blood: Secondary | ICD-10-CM

## 2023-01-14 DIAGNOSIS — D7589 Other specified diseases of blood and blood-forming organs: Secondary | ICD-10-CM | POA: Diagnosis not present

## 2023-01-14 DIAGNOSIS — Z79899 Other long term (current) drug therapy: Secondary | ICD-10-CM | POA: Diagnosis not present

## 2023-01-14 DIAGNOSIS — Z9081 Acquired absence of spleen: Secondary | ICD-10-CM | POA: Diagnosis not present

## 2023-01-14 DIAGNOSIS — N183 Chronic kidney disease, stage 3 unspecified: Secondary | ICD-10-CM | POA: Diagnosis not present

## 2023-01-14 DIAGNOSIS — D75838 Other thrombocytosis: Secondary | ICD-10-CM | POA: Diagnosis not present

## 2023-01-14 DIAGNOSIS — Z905 Acquired absence of kidney: Secondary | ICD-10-CM | POA: Diagnosis not present

## 2023-01-14 DIAGNOSIS — Z7901 Long term (current) use of anticoagulants: Secondary | ICD-10-CM | POA: Diagnosis not present

## 2023-01-14 DIAGNOSIS — F10939 Alcohol use, unspecified with withdrawal, unspecified: Secondary | ICD-10-CM | POA: Diagnosis not present

## 2023-01-14 DIAGNOSIS — I82622 Acute embolism and thrombosis of deep veins of left upper extremity: Secondary | ICD-10-CM | POA: Diagnosis not present

## 2023-01-14 DIAGNOSIS — D75839 Thrombocytosis, unspecified: Secondary | ICD-10-CM | POA: Diagnosis not present

## 2023-01-14 LAB — CBC WITH DIFFERENTIAL/PLATELET
Abs Immature Granulocytes: 0.01 10*3/uL (ref 0.00–0.07)
Basophils Absolute: 0 10*3/uL (ref 0.0–0.1)
Basophils Relative: 0 %
Eosinophils Absolute: 0.3 10*3/uL (ref 0.0–0.5)
Eosinophils Relative: 4 %
HCT: 39.7 % (ref 39.0–52.0)
Hemoglobin: 13.7 g/dL (ref 13.0–17.0)
Immature Granulocytes: 0 %
Lymphocytes Relative: 22 %
Lymphs Abs: 1.6 10*3/uL (ref 0.7–4.0)
MCH: 33.2 pg (ref 26.0–34.0)
MCHC: 34.5 g/dL (ref 30.0–36.0)
MCV: 96.1 fL (ref 80.0–100.0)
Monocytes Absolute: 1.1 10*3/uL — ABNORMAL HIGH (ref 0.1–1.0)
Monocytes Relative: 14 %
Neutro Abs: 4.4 10*3/uL (ref 1.7–7.7)
Neutrophils Relative %: 60 %
Platelets: 306 10*3/uL (ref 150–400)
RBC: 4.13 MIL/uL — ABNORMAL LOW (ref 4.22–5.81)
RDW: 12.4 % (ref 11.5–15.5)
WBC: 7.3 10*3/uL (ref 4.0–10.5)
nRBC: 0 % (ref 0.0–0.2)
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
## 2023-01-15 DIAGNOSIS — F32A Depression, unspecified: Secondary | ICD-10-CM | POA: Diagnosis not present

## 2023-01-15 DIAGNOSIS — Z9081 Acquired absence of spleen: Secondary | ICD-10-CM | POA: Insufficient documentation

## 2023-01-15 DIAGNOSIS — L03114 Cellulitis of left upper limb: Secondary | ICD-10-CM | POA: Diagnosis not present

## 2023-01-15 DIAGNOSIS — I160 Hypertensive urgency: Secondary | ICD-10-CM | POA: Diagnosis not present

## 2023-01-15 DIAGNOSIS — F10239 Alcohol dependence with withdrawal, unspecified: Secondary | ICD-10-CM | POA: Diagnosis not present

## 2023-01-15 DIAGNOSIS — R7889 Finding of other specified substances, not normally found in blood: Secondary | ICD-10-CM | POA: Insufficient documentation

## 2023-01-15 DIAGNOSIS — I480 Paroxysmal atrial fibrillation: Secondary | ICD-10-CM | POA: Diagnosis not present

## 2023-01-15 DIAGNOSIS — I700 Atherosclerosis of aorta: Secondary | ICD-10-CM | POA: Diagnosis not present

## 2023-01-15 DIAGNOSIS — I82622 Acute embolism and thrombosis of deep veins of left upper extremity: Secondary | ICD-10-CM | POA: Diagnosis not present

## 2023-01-15 DIAGNOSIS — K5730 Diverticulosis of large intestine without perforation or abscess without bleeding: Secondary | ICD-10-CM | POA: Diagnosis not present

## 2023-01-15 DIAGNOSIS — I10 Essential (primary) hypertension: Secondary | ICD-10-CM | POA: Diagnosis not present

## 2023-01-22 DIAGNOSIS — I160 Hypertensive urgency: Secondary | ICD-10-CM | POA: Diagnosis not present

## 2023-01-22 DIAGNOSIS — I480 Paroxysmal atrial fibrillation: Secondary | ICD-10-CM | POA: Diagnosis not present

## 2023-01-22 DIAGNOSIS — I10 Essential (primary) hypertension: Secondary | ICD-10-CM | POA: Diagnosis not present

## 2023-01-22 DIAGNOSIS — I82622 Acute embolism and thrombosis of deep veins of left upper extremity: Secondary | ICD-10-CM | POA: Diagnosis not present

## 2023-01-22 DIAGNOSIS — L03114 Cellulitis of left upper limb: Secondary | ICD-10-CM | POA: Diagnosis not present

## 2023-01-22 DIAGNOSIS — I700 Atherosclerosis of aorta: Secondary | ICD-10-CM | POA: Diagnosis not present

## 2023-01-22 DIAGNOSIS — F32A Depression, unspecified: Secondary | ICD-10-CM | POA: Diagnosis not present

## 2023-01-22 DIAGNOSIS — K5730 Diverticulosis of large intestine without perforation or abscess without bleeding: Secondary | ICD-10-CM | POA: Diagnosis not present

## 2023-01-22 DIAGNOSIS — F10239 Alcohol dependence with withdrawal, unspecified: Secondary | ICD-10-CM | POA: Diagnosis not present

## 2023-02-12 ENCOUNTER — Other Ambulatory Visit: Payer: Self-pay

## 2023-02-12 DIAGNOSIS — I722 Aneurysm of renal artery: Secondary | ICD-10-CM

## 2023-03-04 ENCOUNTER — Ambulatory Visit (HOSPITAL_COMMUNITY)
Admission: RE | Admit: 2023-03-04 | Discharge: 2023-03-04 | Disposition: A | Discharge status: Home / Self Care | Payer: Medicare PPO | Source: Ambulatory Visit | Attending: Vascular Surgery | Admitting: Vascular Surgery

## 2023-03-04 DIAGNOSIS — J9811 Atelectasis: Secondary | ICD-10-CM | POA: Diagnosis not present

## 2023-03-04 DIAGNOSIS — I722 Aneurysm of renal artery: Secondary | ICD-10-CM

## 2023-03-04 DIAGNOSIS — N200 Calculus of kidney: Secondary | ICD-10-CM | POA: Diagnosis not present

## 2023-03-04 MED ORDER — IOHEXOL 350 MG/ML SOLN
100.0000 mL | Freq: Once | INTRAVENOUS | Status: AC | PRN
  Administered 2023-03-04: 100 mL via INTRAVENOUS

## 2023-03-05 LAB — POCT I-STAT CREATININE: Creatinine, Ser: 1.5 mg/dL — ABNORMAL HIGH (ref 0.61–1.24)

## 2023-03-17 ENCOUNTER — Ambulatory Visit: Payer: Medicare PPO | Admitting: Vascular Surgery

## 2023-03-17 ENCOUNTER — Encounter: Payer: Self-pay | Admitting: Vascular Surgery

## 2023-03-17 VITALS — BP 136/88 | HR 71 | Temp 98.5°F | Resp 20 | Ht 69.5 in | Wt 189.0 lb

## 2023-03-17 DIAGNOSIS — I722 Aneurysm of renal artery: Secondary | ICD-10-CM

## 2023-03-17 NOTE — Progress Notes (Signed)
[facility] [place]  ASSESSMENT / PLAN: 77 y.o. male with 17mm solitary right renal artery aneurysm. Current SVS guidelines recommend repair once aneurysm grows to 30 mm or more.  He has 3 consecutive studies which have demonstrated stability of the aneurysm. We will see him again in 2-3 years with a CT angiogram of the abdomen and pelvis.  He should continue aspirin 81 mg by mouth daily.  CHIEF COMPLAINT: renal artery aneurysm  HISTORY OF PRESENT ILLNESS: CODY ALBUS is a 77 y.o. male with a solitary right kidney with a branch aneurysm.  This was identified on CT scan.  A CT angiogram was performed.  This confirms a 16 mm distal renal artery aneurysm at the branch point.  The patient has no urinary symptoms.  We reviewed the natural history of renal artery aneurysms and the current SVS guidelines.  03/17/23: doing well overall. No major issues outside of some shoulder discomfort. We reviewed his CT angiogram in detail.   Past Medical History:  Diagnosis Date   ASD (atrial septal defect)    CVA (cerebral vascular accident) (HCC)    small right frontal    Hypertension     Past Surgical History:  Procedure Laterality Date   ATRIAL SEPTAL DEFECT(ASD) CLOSURE N/A 05/13/2018   Procedure: ATRIAL SEPTAL DEFECT (ASD) CLOSURE;  Surgeon: Tonny Bollman, MD;  Location: [facility] INVASIVE CV LAB;  Service: Cardiovascular;  Laterality: N/A;   BACK SURGERY     KNEE SURGERY     SPLENECTOMY      Family History  Problem Relation Age of Onset   Stroke Mother    Breast cancer Mother    Heart attack Father    Alcohol abuse Father    Stroke Brother    Alcohol abuse Brother     Social History   Socioeconomic History   Marital status: Widowed    Spouse name: Not on file   Number of children: Not on file   Years of education: Not on file   Highest education level: Not on file  Occupational History   Not on file  Tobacco Use   Smoking status: Never   Smokeless tobacco:  Never  Vaping Use   Vaping Use: Never used  Substance and Sexual Activity   Alcohol use: Not Currently    Comment: No alcohol wsince haspital visit   Drug use: Never   Sexual activity: Not on file  Other Topics Concern   Not on file  Social History Narrative   Not on file   Social Determinants of Health   Financial Resource Strain: Not on file  Food Insecurity: No Food Insecurity (01/01/2023)   Hunger Vital Sign    Worried About Running Out of Food in the Last Year: Never true    Ran Out of Food in the Last Year: Never true  Transportation Needs: No Transportation Needs (01/01/2023)   PRAPARE - Administrator, Civil Service (Medical): No    Lack of Transportation (Non-Medical): No  Physical Activity: Not on file  Stress: Not on file  Social Connections: Not on file  Intimate Partner Violence: Not At Risk (01/01/2023)   Humiliation, Afraid, Rape, and Kick questionnaire    Fear of Current or Ex-Partner: No    Emotionally Abused: No    Physically Abused: No    Sexually Abused: No    Allergies  Allergen Reactions   Statins Other (See Comments)    Aching joints.    Current Outpatient  Medications  Medication Sig Dispense Refill   aspirin EC 81 MG tablet Take 1 tablet (81 mg total) by mouth daily. 90 tablet 3   chlordiazePOXIDE (LIBRIUM) 25 MG capsule  PO TID x 1D, then 25-50mg  PO BID X 1D, then 25-50mg  PO QD X 1D 10 capsule 0   ELIQUIS 5 MG TABS tablet Take 5 mg by mouth 2 (two) times daily.     Multiple Vitamin (MULTIVITAMIN) tablet Take 1 tablet by mouth daily.     Probiotic Product (PROBIOTIC DAILY PO) Take by mouth.     ramipril (ALTACE) 10 MG capsule Take 10 mg by mouth daily.      No current facility-administered medications for this visit.    PHYSICAL EXAM There were no vitals filed for this visit.   Constitutional: well appearing. no distress. Appears well nourished.  Neurologic: CN intact. no focal findings. no sensory loss. Psychiatric:  Mood  and affect symmetric and appropriate. Eyes:  No icterus. No conjunctival pallor. Ears, nose, throat:  mucous membranes moist. Midline trachea.  Cardiac: regular rate and rhythm.  Respiratory:  unlabored. Abdominal:  soft, non-tender, non-distended.  Peripheral vascular: 2+ DP pulses Extremity: no edema. no cyanosis. no pallor.  Skin: no gangrene. no ulceration.  Lymphatic: no Stemmer's sign. no palpable lymphadenopathy.  PERTINENT LABORATORY AND RADIOLOGIC DATA  Most recent CBC    Latest Ref Rng & Units 01/14/2023    3:59 PM 06/01/2021    9:11 PM 04/30/2018    4:08 PM  CBC  WBC 4.0 - 10.5 K/uL 7.3  7.7  6.9   Hemoglobin 13.0 - 17.0 g/dL 40.9  81.1  91.4   Hematocrit 39.0 - 52.0 % 39.7  46.3  43.9   Platelets 150 - 400 K/uL 306  448  371      Most recent CMP    Latest Ref Rng & Units 03/04/2023    2:49 PM 06/01/2021    9:11 PM 11/03/2018    1:02 PM  CMP  Glucose 70 - 99 mg/dL  782    BUN 8 - 23 mg/dL  10    Creatinine 9.56 - 1.24 mg/dL 2.13  0.86    Sodium 578 - 145 mmol/L  138    Potassium 3.5 - 5.1 mmol/L  3.7    Chloride 98 - 111 mmol/L  103    CO2 22 - 32 mmol/L  24    Calcium 8.9 - 10.3 mg/dL  9.7    Total Protein 6.0 - 8.5 g/dL   6.9   Total Bilirubin 0.0 - 1.2 mg/dL   0.4   Alkaline Phos 39 - 117 IU/L   73   AST 0 - 40 IU/L   48   ALT 0 - 44 IU/L   53    CT angiogram of the abdomen / pelvis 01/03/22. Personally reviewed in detail.  There is a solitary right kidney.  Left kidney is surgically absent.  There is a 16 mm aneurysm at the division of the main renal artery which would be technically challenging to repair endovascularly.  Rande Brunt. Lenell Antu, MD FACS [facility] Phone Number: [phone] 03/17/2023 8:08 AM  Total time spent on preparing this encounter including chart review, data review, collecting history, examining the patient, coordinating care for this established patient, 40 minutes.  Portions of this report may  have been transcribed using voice recognition software.  Every effort has been made to ensure accuracy; however, inadvertent computerized transcription errors  may still be present.

## 2023-03-20 DIAGNOSIS — M25571 Pain in right ankle and joints of right foot: Secondary | ICD-10-CM | POA: Diagnosis not present

## 2023-03-20 DIAGNOSIS — M19012 Primary osteoarthritis, left shoulder: Secondary | ICD-10-CM | POA: Diagnosis not present

## 2023-03-23 DIAGNOSIS — M19012 Primary osteoarthritis, left shoulder: Secondary | ICD-10-CM | POA: Diagnosis not present

## 2023-03-25 ENCOUNTER — Telehealth: Payer: Self-pay

## 2023-03-25 DIAGNOSIS — R531 Weakness: Secondary | ICD-10-CM | POA: Diagnosis not present

## 2023-03-25 DIAGNOSIS — Z79899 Other long term (current) drug therapy: Secondary | ICD-10-CM | POA: Diagnosis not present

## 2023-03-25 DIAGNOSIS — Z8673 Personal history of transient ischemic attack (TIA), and cerebral infarction without residual deficits: Secondary | ICD-10-CM | POA: Diagnosis not present

## 2023-03-25 DIAGNOSIS — J449 Chronic obstructive pulmonary disease, unspecified: Secondary | ICD-10-CM | POA: Diagnosis not present

## 2023-03-25 DIAGNOSIS — I10 Essential (primary) hypertension: Secondary | ICD-10-CM | POA: Diagnosis not present

## 2023-03-25 DIAGNOSIS — E860 Dehydration: Secondary | ICD-10-CM | POA: Diagnosis not present

## 2023-03-25 DIAGNOSIS — R42 Dizziness and giddiness: Secondary | ICD-10-CM | POA: Diagnosis not present

## 2023-03-25 DIAGNOSIS — R197 Diarrhea, unspecified: Secondary | ICD-10-CM | POA: Diagnosis not present

## 2023-03-25 DIAGNOSIS — F419 Anxiety disorder, unspecified: Secondary | ICD-10-CM | POA: Diagnosis not present

## 2023-03-25 DIAGNOSIS — M25512 Pain in left shoulder: Secondary | ICD-10-CM | POA: Diagnosis not present

## 2023-03-25 DIAGNOSIS — Z20822 Contact with and (suspected) exposure to covid-19: Secondary | ICD-10-CM | POA: Diagnosis not present

## 2023-03-25 NOTE — Telephone Encounter (Signed)
This encounter was created in error - please disregard.

## 2023-03-25 NOTE — Telephone Encounter (Signed)
Pt left voicemail on my phone in regards to needing to arrange an appointment with cardiology due to issues that he is currently having.  I attempted to reach the patient but got his voicemail. The patient has a history of ASD closure in 2019 with Dr Excell Seltzer.

## 2023-03-26 NOTE — Telephone Encounter (Signed)
The patient reports he has been experiencing dizziness and fatigue. He went to the ER recently and was dehydrated.  He is concerned about his heart. He is s/p ASD closure with Dr. Excell Seltzer in 2019 and requests a follow-up visit.  Scheduled the patient for visit with Dr. Excell Seltzer 03/30/23. Encouraged the patient to drink plenty of water over the weekend and to move slowly, especially when moving from lying to standing.  He was grateful for assistance.

## 2023-03-30 ENCOUNTER — Ambulatory Visit: Payer: Medicare PPO | Attending: Cardiovascular Disease | Admitting: Cardiovascular Disease

## 2023-03-30 ENCOUNTER — Encounter: Payer: Self-pay | Admitting: Cardiovascular Disease

## 2023-03-30 VITALS — BP 127/82 | HR 73 | Ht 69.5 in | Wt 184.8 lb

## 2023-03-30 DIAGNOSIS — F1020 Alcohol dependence, uncomplicated: Secondary | ICD-10-CM | POA: Diagnosis not present

## 2023-03-30 DIAGNOSIS — G4700 Insomnia, unspecified: Secondary | ICD-10-CM | POA: Diagnosis not present

## 2023-03-30 DIAGNOSIS — I10 Essential (primary) hypertension: Secondary | ICD-10-CM

## 2023-03-30 DIAGNOSIS — E782 Mixed hyperlipidemia: Secondary | ICD-10-CM | POA: Diagnosis not present

## 2023-03-30 DIAGNOSIS — Q211 Atrial septal defect, unspecified: Secondary | ICD-10-CM

## 2023-03-30 DIAGNOSIS — I129 Hypertensive chronic kidney disease with stage 1 through stage 4 chronic kidney disease, or unspecified chronic kidney disease: Secondary | ICD-10-CM | POA: Diagnosis not present

## 2023-03-30 DIAGNOSIS — Z6827 Body mass index (BMI) 27.0-27.9, adult: Secondary | ICD-10-CM | POA: Diagnosis not present

## 2023-03-30 DIAGNOSIS — N183 Chronic kidney disease, stage 3 unspecified: Secondary | ICD-10-CM | POA: Diagnosis not present

## 2023-03-30 NOTE — Patient Instructions (Signed)
Medication Instructions:  Your physician recommends that you continue on your current medications as directed. Please refer to the Current Medication list given to you today.  *If you need a refill on your cardiac medications before your next appointment, please call your pharmacy*   Lab Work: NONE If you have labs (blood work) drawn today and your tests are completely normal, you will receive your results only by: MyChart Message (if you have MyChart) OR A paper copy in the mail If you have any lab test that is abnormal or we need to change your treatment, we will call you to review the results.   Testing/Procedures: ECHO Your physician has requested that you have an echocardiogram. Echocardiography is a painless test that uses sound waves to create images of your heart. It provides your doctor with information about the size and shape of your heart and how well your heart's chambers and valves are working. This procedure takes approximately one hour. There are no restrictions for this procedure. Please do NOT wear cologne, perfume, aftershave, or lotions (deodorant is allowed). Please arrive 15 minutes prior to your appointment time.  Follow-Up: At [facility], you and your health needs are our priority.  As part of our continuing mission to provide you with exceptional heart care, we have created designated Provider Care Teams.  These Care Teams include your primary Cardiologist (physician) and Advanced Practice Providers (APPs -  Physician Assistants and Nurse Practitioners) who all work together to provide you with the care you need, when you need it.  Your next appointment:   1 year(s)  Provider:   Michael Cooper, MD     

## 2023-03-30 NOTE — Progress Notes (Signed)
Cardiology Office Note:    Date:  03/30/2023   ID:  Marcus Wise, DOB October 17, 1946, MRN [id number]  PCP:  Doran Stabler, NP   [facility] Providers Cardiologist:  None     Referring MD: Doran Stabler, *   Chief Complaint  Patient presents with   Follow-up    Atrial Septal Defect    History of Present Illness:    Marcus Wise is a 77 y.o. male presenting for follow-up of atrial septal defect.  The patient has a history of hypertension, DVT, and stroke.  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  He ultimately underwent transcatheter closure with a 12 mm Amplatzer atrial septal occluder device.  He has been maintained on apixaban.  The patient is here alone today. He has been concerned that he has become dizzy when he stands up. He feels like his blood pressure has been running too low. He wants to get back to jogging but has been concerned about his dizziness and gait unsteadiness. He denies chest pain or pressure. He complains of exertional dyspnea which he attributes to "being out of shape."  He denies orthopnea, PND, or near syncope.  No heart palpitations.  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   Multiple Vitamin (MULTIVITAMIN) tablet Take 1 tablet by mouth daily.   Probiotic Product (PROBIOTIC  DAILY PO) Take by mouth.   QUEtiapine (SEROQUEL) 25 MG tablet Take 25 mg by mouth at bedtime.   ramipril (ALTACE) 10 MG capsule Take 10 mg by mouth daily.    tamsulosin (FLOMAX) 0.4 MG CAPS capsule Take 0.4 mg by mouth.   [DISCONTINUED] aspirin EC 81 MG tablet Take 1 tablet (81 mg total) by mouth daily.   [DISCONTINUED] chlordiazePOXIDE (LIBRIUM) 25 MG capsule 50mg  PO TID x 1D, then 25-50mg  PO BID X 1D, then 25-50mg  PO QD X 1D   [DISCONTINUED] diclofenac (VOLTAREN) 50 MG EC tablet Take 50 mg by mouth 2 (two) times daily. Patient taking Gel 1%   [DISCONTINUED] ELIQUIS 5 MG TABS tablet Take 5 mg by mouth 2 (two) times daily.     Allergies:   Statins   Social History   Socioeconomic History   Marital status: Widowed    Spouse name: Not on file   Number of children: Not on file   Years of education: Not on file   Highest education level: Not on file  Occupational History   Not on file  Tobacco Use   Smoking status: Never   Smokeless tobacco: Never  Vaping Use   Vaping Use: Never used  Substance and Sexual Activity   Alcohol use: Not Currently    Comment: No alcohol wsince haspital visit   Drug use: Never   Sexual  activity: Not on file  Other Topics Concern   Not on file  Social History Narrative   Not on file   Social Determinants of Health   Financial Resource Strain: Not on file  Food Insecurity: No Food Insecurity (01/01/2023)   Hunger Vital Sign    Worried About Running Out of Food in the Last Year: Never true    Ran Out of Food in the Last Year: Never true  Transportation Needs: No Transportation Needs (01/01/2023)   PRAPARE - Administrator, Civil Service (Medical): No    Lack of Transportation (Non-Medical): No  Physical Activity: Not on file  Stress: Not on file  Social Connections: Not on file     Family History: The patient's family history includes Alcohol abuse in his brother and father; Breast cancer in his mother; Heart attack in his father;  Stroke in his brother and mother.  ROS:   Please see the history of present illness.    All other systems reviewed and are negative.  EKGs/Labs/Other Studies Reviewed:    The following studies were reviewed today: Cardiac Studies & Procedures   CARDIAC CATHETERIZATION  CARDIAC CATHETERIZATION 05/13/2018  Narrative Successful transcatheter ASD closure using a 12 mm Amplatzer Septal Occluder device under fluoroscopic and intracardiac echo guidance     ECHOCARDIOGRAM  ECHOCARDIOGRAM LIMITED BUBBLE STUDY 06/30/2019  Narrative ECHOCARDIOGRAM LIMITED REPORT    Patient Name:   Marcus Wise Date of Exam: 06/30/2019 Medical Rec #:  [id number]        Height:       69.0 in Accession #:    [id number]       Weight:       182.0 lb Date of Birth:  1946/09/10       BSA:          1.98 m Patient Age:    72 years         BP:           164/109 mmHg Patient Gender: M                HR:           72 bpm. Exam Location:  [address]   Procedure: Limited Echo, Cardiac Doppler, Color Doppler and Saline Contrast Bubble Study  Indications:    Q21.1 ASD. LIMITED to rule out shunt.  History:        Patient has prior history of Echocardiogram examinations, most recent 05/13/2018. Stroke Risk Factors: Hypertension and Dyslipidemia. - Septal Repair:77mm Amplatzer on 05/13/18. ASD.  Sonographer:    Garald Braver, RDCS Referring Phys: [id number] Wille Celeste THOMPSON  IMPRESSIONS   1. The right ventricle has normal systolc function. The cavity was normal. There is no increase in right ventricular wall thickness. 2. There is an Amplatzer atrial septal closure device in place. No evidence by doppler for residual shunting. Bubble study was negative. 3. No evidence of mitral valve stenosis. No significant mitral regurgitation. 4. The aortic valve is tricuspid No stenosis of the aortic valve. 5. The left ventricle had a visually estimated ejection fraction of of 50%. Left ventricular diastolic Doppler  parameters are consistent with indeterminate diastolic dysfunction. Left ventricular diffuse hypokinesis. 6. The IVC was normal in size. No complete TR doppler jet so unable to estimate PA systolic pressure.  FINDINGS Left Ventricle: The left ventricle has a visually estimated ejection fraction of of 50%. Left ventricular diastolic Doppler parameters are consistent with indeterminate diastolic dysfunction. Left  ventricular diffuse hypokinesis.   Right Ventricle: The right ventricle has normal systolic function. The cavity was normal. There is no increase in right ventricular wall thickness.  Left Atrium: Left atrial size was normal in size.  Right Atrium: Right atrial size was normal in size. Right atrial pressure is estimated at 10 mmHg.  Interatrial Septum: Agitated saline contrast was given intravenously to evaluate for intracardiac shunting. There is an Amplatzer atrial septal closure device in place. No evidence by doppler for residual shunting. Bubble study was negative.  Mitral Valve: The mitral valve is normal in structure. Mitral valve regurgitation is not visualized by color flow Doppler. No evidence of mitral valve stenosis.  Tricuspid Valve: The tricuspid valve was normal in structure. Tricuspid valve regurgitation was not visualized by color flow Doppler.  Aortic Valve: The aortic valve is tricuspid Aortic valve regurgitation was not visualized by color flow Doppler. There is No stenosis of the aortic valve.  Pulmonic Valve: The pulmonic valve was normal in structure. Pulmonic valve regurgitation is trivial by color flow Doppler. No evidence of pulmonic stenosis.  Venous: The inferior vena cava is normal in size with greater than 50% respiratory variability.  Compared to previous exam: 05/13/18 EF 45-50%.   +--------------+-------++ LEFT VENTRICLE        +--------------+-------++ PLAX 2D               +--------------+-------++ LVIDd:        5.80  cm +--------------+-------++ LVIDs:        4.60 cm +--------------+-------++ LV PW:        0.70 cm +--------------+-------++ LV IVS:       0.60 cm +--------------+-------++ LV SV:        69 ml   +--------------+-------++ LV SV Index:  34.28   +--------------+-------++                       +--------------+-------++  +-----------+-------++----------++ LEFT ATRIUM       Index      +-----------+-------++----------++ LA diam:   3.40 cm1.71 cm/m +-----------+-------++----------++  +-------------+-------++ AORTA                +-------------+-------++ Ao Root diam:3.40 cm +-------------+-------++   Marca Ancona MD Electronically signed by Marca Ancona MD Signature Date/Time: 06/30/2019/4:44:29 PM    Final     CT SCANS  CT CORONARY MORPH W/CTA COR W/SCORE 04/12/2018  Addendum 04/12/2018  2:52 PM ADDENDUM REPORT: 04/12/2018 14:50  CLINICAL DATA:  Chest pain  EXAM: Cardiac CTA  MEDICATIONS: Sub lingual nitro. 4mg  x 2  TECHNIQUE: The patient was scanned on a Siemens 192 slice scanner. Gantry rotation speed was 250 msecs. Collimation was 0.6 mm. A 100 kV prospective scan was triggered in the ascending thoracic aorta at 35-75% of the R-R interval. Average HR during the scan was 60 bpm. The 3D data set was interpreted on a dedicated work station using MPR, MIP and VRT modes. A total of 80cc of contrast was used.  FINDINGS: Non-cardiac: See separate report from [facility].  There is a relatively small secundum ASD located in the mid-atrial septum. It measures approximately 10 x 10 mm. The pulmonary veins drain normally to the left atrium, no anomalous pulmonary venous return.  Calcium Score: 0 Agatston units.  Coronary Arteries: Right dominant with no anomalies  LM: No plaque or stenosis.  LAD system: No plaque or stenosis.  Circumflex system: No plaque or stenosis.  RCA system: No plaque or  stenosis.  IMPRESSION: 1. Coronary artery calcium score 0 Agatston units, suggesting low risk for future cardiac events.  2. Surprising based on age, but no significant coronary disease noted in the coronary tree.  3.  Small secundum ASD.  4.  No anomalous pulmonary vein drainage.  Dalton Mclean   Electronically Signed By: Marca Ancona M.D. On: 04/12/2018 14:50  Narrative EXAM: OVER-READ INTERPRETATION  CT CHEST  The following report is an over-read performed by radiologist Dr. Trudie Reed of [facility] on 04/12/2018. This over-read does not include interpretation of cardiac or coronary anatomy or pathology. The coronary calcium score/coronary CTA interpretation by the cardiologist is attached.  COMPARISON:  None.  FINDINGS: Scarring in the periphery of the inferior segment of the lingula adjacent to multiple old healed rib fractures which have an associated posttraumatic deformity. Within the visualized portions of the thorax there are no suspicious appearing pulmonary nodules or masses, there is no acute consolidative airspace disease, no pleural effusions, no pneumothorax and no lymphadenopathy. Visualized portions of the upper abdomen are unremarkable. There are no aggressive appearing lytic or blastic lesions noted in the visualized portions of the skeleton.  IMPRESSION: 1. Multiple old healed left-sided rib fractures with posttraumatic deformity and underlying scarring in the inferior segment of the lingula.  Electronically Signed: By: Trudie Reed M.D. On: 04/12/2018 11:11           EKG:  EKG is ordered today.  The ekg ordered today demonstrates NSR 73 bpm, within normal limits  Recent Labs: 01/14/2023: Hemoglobin 13.7; Platelets 306 03/04/2023: Creatinine, Ser 1.50  Recent Lipid Panel No results found for: "CHOL", "TRIG", "HDL", "CHOLHDL", "VLDL", "LDLCALC", "LDLDIRECT"   Risk Assessment/Calculations:           Physical  Exam:    VS:  BP 127/82   Pulse 73   Ht 5' 9.5" (1.765 m)   Wt 184 lb 12.8 oz (83.8 kg)   SpO2 96%   BMI 26.90 kg/m     Wt Readings from Last 3 Encounters:  03/30/23 184 lb 12.8 oz (83.8 kg)  03/17/23 189 lb (85.7 kg)  01/14/23 181 lb 3.2 oz (82.2 kg)     GEN:  Well nourished, well developed in no acute distress HEENT: Normal NECK: No JVD; No carotid bruits LYMPHATICS: No lymphadenopathy CARDIAC: RRR, no murmurs, rubs, gallops RESPIRATORY:  Clear to auscultation without rales, wheezing or rhonchi  ABDOMEN: Soft, non-tender, non-distended MUSCULOSKELETAL:  No edema; No deformity  SKIN: Warm and dry NEUROLOGIC:  Alert and oriented x 3 PSYCHIATRIC:  Normal affect   ASSESSMENT:    1. ASD (atrial septal defect)   2. Primary hypertension    PLAN:    In order of problems listed above:  The patient's exam is unremarkable.  However, he has new symptoms of dizziness.  He is concerned that this could be related low blood pressure.  His orthostatic vital signs are normal today.  I think we should check a 2D echocardiogram to evaluate the integrity of his ASD closure, cardiac chamber size, and assess LV function. Blood pressure appears to be controlled on ramipril 10 mg daily.  Will continue.  Labs followed by primary care physician with recent creatinine of 1.5, potassium 5.2.           Medication Adjustments/Labs and Tests Ordered: Current medicines are reviewed at length with the patient today.  Concerns regarding medicines are outlined above.  Orders Placed This Encounter  Procedures   EKG 12-Lead   ECHOCARDIOGRAM  COMPLETE   No orders of the defined types were placed in this encounter.   Patient Instructions  Medication Instructions:  Your physician recommends that you continue on your current medications as directed. Please refer to the Current Medication list given to you today.  *If you need a refill on your cardiac medications before your next appointment, please  call your pharmacy*   Lab Work: NONE If you have labs (blood work) drawn today and your tests are completely normal, you will receive your results only by: MyChart Message (if you have MyChart) OR A paper copy in the mail If you have any lab test that is abnormal or we need to change your treatment, we will call you to review the results.   Testing/Procedures: ECHO Your physician has requested that you have an echocardiogram. Echocardiography is a painless test that uses sound waves to create images of your heart. It provides your doctor with information about the size and shape of your heart and how well your heart's chambers and valves are working. This procedure takes approximately one hour. There are no restrictions for this procedure. Please do NOT wear cologne, perfume, aftershave, or lotions (deodorant is allowed). Please arrive 15 minutes prior to your appointment time.  Follow-Up: At [facility], you and your health needs are our priority.  As part of our continuing mission to provide you with exceptional heart care, we have created designated Provider Care Teams.  These Care Teams include your primary Cardiologist (physician) and Advanced Practice Providers (APPs -  Physician Assistants and Nurse Practitioners) who all work together to provide you with the care you need, when you need it.  Your next appointment:   1 year(s)  Provider:   Tonny Bollman, MD     Signed, Tonny Bollman, MD  03/30/2023 2:30 PM    [facility]

## 2023-04-13 DIAGNOSIS — N5314 Retrograde ejaculation: Secondary | ICD-10-CM | POA: Diagnosis not present

## 2023-04-13 DIAGNOSIS — N401 Enlarged prostate with lower urinary tract symptoms: Secondary | ICD-10-CM | POA: Diagnosis not present

## 2023-04-13 DIAGNOSIS — F1020 Alcohol dependence, uncomplicated: Secondary | ICD-10-CM | POA: Diagnosis not present

## 2023-04-21 ENCOUNTER — Ambulatory Visit (HOSPITAL_COMMUNITY): Payer: Medicare PPO | Attending: Internal Medicine

## 2023-04-21 DIAGNOSIS — I10 Essential (primary) hypertension: Secondary | ICD-10-CM | POA: Diagnosis not present

## 2023-04-21 DIAGNOSIS — Q211 Atrial septal defect, unspecified: Secondary | ICD-10-CM

## 2023-04-21 LAB — ECHOCARDIOGRAM COMPLETE
Area-P 1/2: 2.7 cm2
S' Lateral: 4.2 cm

## 2023-04-27 DIAGNOSIS — R7989 Other specified abnormal findings of blood chemistry: Secondary | ICD-10-CM | POA: Diagnosis not present

## 2023-04-27 DIAGNOSIS — N401 Enlarged prostate with lower urinary tract symptoms: Secondary | ICD-10-CM | POA: Diagnosis not present

## 2023-05-18 DIAGNOSIS — R42 Dizziness and giddiness: Secondary | ICD-10-CM | POA: Diagnosis not present

## 2023-05-18 DIAGNOSIS — M791 Myalgia, unspecified site: Secondary | ICD-10-CM | POA: Diagnosis not present

## 2023-05-18 DIAGNOSIS — I10 Essential (primary) hypertension: Secondary | ICD-10-CM | POA: Diagnosis not present

## 2023-05-26 DIAGNOSIS — F32A Depression, unspecified: Secondary | ICD-10-CM | POA: Diagnosis not present

## 2023-05-26 DIAGNOSIS — R7401 Elevation of levels of liver transaminase levels: Secondary | ICD-10-CM | POA: Diagnosis not present

## 2023-05-26 DIAGNOSIS — Z905 Acquired absence of kidney: Secondary | ICD-10-CM | POA: Diagnosis not present

## 2023-05-26 DIAGNOSIS — I160 Hypertensive urgency: Secondary | ICD-10-CM | POA: Diagnosis not present

## 2023-05-26 DIAGNOSIS — G9389 Other specified disorders of brain: Secondary | ICD-10-CM | POA: Diagnosis not present

## 2023-05-26 DIAGNOSIS — I10 Essential (primary) hypertension: Secondary | ICD-10-CM | POA: Diagnosis not present

## 2023-05-26 DIAGNOSIS — R42 Dizziness and giddiness: Secondary | ICD-10-CM | POA: Diagnosis not present

## 2023-05-26 DIAGNOSIS — R2681 Unsteadiness on feet: Secondary | ICD-10-CM | POA: Diagnosis not present

## 2023-05-26 DIAGNOSIS — R531 Weakness: Secondary | ICD-10-CM | POA: Diagnosis not present

## 2023-05-26 DIAGNOSIS — R1010 Upper abdominal pain, unspecified: Secondary | ICD-10-CM | POA: Diagnosis not present

## 2023-05-26 DIAGNOSIS — E860 Dehydration: Secondary | ICD-10-CM | POA: Diagnosis not present

## 2023-05-26 DIAGNOSIS — R748 Abnormal levels of other serum enzymes: Secondary | ICD-10-CM | POA: Diagnosis not present

## 2023-05-26 DIAGNOSIS — R4182 Altered mental status, unspecified: Secondary | ICD-10-CM | POA: Diagnosis not present

## 2023-05-27 DIAGNOSIS — R748 Abnormal levels of other serum enzymes: Secondary | ICD-10-CM | POA: Diagnosis not present

## 2023-05-27 DIAGNOSIS — I160 Hypertensive urgency: Secondary | ICD-10-CM | POA: Diagnosis not present

## 2023-05-27 DIAGNOSIS — E860 Dehydration: Secondary | ICD-10-CM | POA: Diagnosis not present

## 2023-06-11 DIAGNOSIS — F1090 Alcohol use, unspecified, uncomplicated: Secondary | ICD-10-CM | POA: Diagnosis not present

## 2023-06-11 DIAGNOSIS — K4090 Unilateral inguinal hernia, without obstruction or gangrene, not specified as recurrent: Secondary | ICD-10-CM | POA: Diagnosis not present

## 2023-06-11 DIAGNOSIS — R109 Unspecified abdominal pain: Secondary | ICD-10-CM | POA: Diagnosis not present

## 2023-06-11 DIAGNOSIS — Z1152 Encounter for screening for COVID-19: Secondary | ICD-10-CM | POA: Diagnosis not present

## 2023-06-11 DIAGNOSIS — Z79899 Other long term (current) drug therapy: Secondary | ICD-10-CM | POA: Diagnosis not present

## 2023-06-11 DIAGNOSIS — K5730 Diverticulosis of large intestine without perforation or abscess without bleeding: Secondary | ICD-10-CM | POA: Diagnosis not present

## 2023-06-11 DIAGNOSIS — R1013 Epigastric pain: Secondary | ICD-10-CM | POA: Diagnosis not present

## 2023-06-11 DIAGNOSIS — N400 Enlarged prostate without lower urinary tract symptoms: Secondary | ICD-10-CM | POA: Diagnosis not present

## 2023-06-11 DIAGNOSIS — I10 Essential (primary) hypertension: Secondary | ICD-10-CM | POA: Diagnosis not present

## 2023-06-11 DIAGNOSIS — I639 Cerebral infarction, unspecified: Secondary | ICD-10-CM | POA: Diagnosis not present

## 2023-06-11 DIAGNOSIS — E7800 Pure hypercholesterolemia, unspecified: Secondary | ICD-10-CM | POA: Diagnosis not present

## 2023-06-17 ENCOUNTER — Telehealth: Payer: Self-pay

## 2023-06-17 NOTE — Telephone Encounter (Signed)
Transition Care Management Follow-up Telephone Call Date of discharge and from where: 06/12/2023 [facility] How have you been since you were released from the hospital? Patient stated he is feeling much better. Any questions or concerns? No  Items Reviewed: Did the pt receive and understand the discharge instructions provided? Yes  Medications obtained and verified? Yes  Other? No  Any new allergies since your discharge? No  Dietary orders reviewed? Yes Do you have support at home? Yes   Follow up appointments reviewed:  PCP Hospital f/u appt confirmed? No  Scheduled to see  on  @ . Specialist Hospital f/u appt confirmed? No  Scheduled to see  on  @ . Are transportation arrangements needed? No  If their condition worsens, is the pt aware to call PCP or go to the Emergency Dept.? Yes Was the patient provided with contact information for the PCP's office or ED? Yes Was to pt encouraged to call back with questions or concerns? Yes  Miken Stecher [facility]  [facility] Population Health Community Resource Care Guide   [email or web address]  ?? [phone]   Website: [email or web address]  [email or web address]

## 2023-06-17 NOTE — Telephone Encounter (Signed)
Transition Care Management Unsuccessful Follow-up Telephone Call  Date of discharge and from where:  06/12/2023 [facility]  Attempts:  1st Attempt  Reason for unsuccessful TCM follow-up call:  No answer/busy  Mckenzye Cutright [facility]  [facility] Population Health Community Resource Care Guide   [email or web address]  ?? [phone]   Website: [email or web address]  [email or web address]

## 2023-06-30 DIAGNOSIS — G4700 Insomnia, unspecified: Secondary | ICD-10-CM | POA: Diagnosis not present

## 2023-06-30 DIAGNOSIS — I129 Hypertensive chronic kidney disease with stage 1 through stage 4 chronic kidney disease, or unspecified chronic kidney disease: Secondary | ICD-10-CM | POA: Diagnosis not present

## 2023-06-30 DIAGNOSIS — I82629 Acute embolism and thrombosis of deep veins of unspecified upper extremity: Secondary | ICD-10-CM | POA: Diagnosis not present

## 2023-06-30 DIAGNOSIS — N183 Chronic kidney disease, stage 3 unspecified: Secondary | ICD-10-CM | POA: Diagnosis not present

## 2023-06-30 DIAGNOSIS — F1020 Alcohol dependence, uncomplicated: Secondary | ICD-10-CM | POA: Diagnosis not present

## 2023-06-30 DIAGNOSIS — E782 Mixed hyperlipidemia: Secondary | ICD-10-CM | POA: Diagnosis not present

## 2023-06-30 DIAGNOSIS — Z6827 Body mass index (BMI) 27.0-27.9, adult: Secondary | ICD-10-CM | POA: Diagnosis not present

## 2023-08-17 DIAGNOSIS — N401 Enlarged prostate with lower urinary tract symptoms: Secondary | ICD-10-CM | POA: Diagnosis not present

## 2023-08-22 DIAGNOSIS — I10 Essential (primary) hypertension: Secondary | ICD-10-CM | POA: Diagnosis not present

## 2023-08-22 DIAGNOSIS — I700 Atherosclerosis of aorta: Secondary | ICD-10-CM | POA: Diagnosis not present

## 2023-08-22 DIAGNOSIS — R531 Weakness: Secondary | ICD-10-CM | POA: Diagnosis not present

## 2023-08-22 DIAGNOSIS — M1990 Unspecified osteoarthritis, unspecified site: Secondary | ICD-10-CM | POA: Diagnosis not present

## 2023-08-22 DIAGNOSIS — E7800 Pure hypercholesterolemia, unspecified: Secondary | ICD-10-CM | POA: Diagnosis not present

## 2023-08-22 DIAGNOSIS — E8721 Acute metabolic acidosis: Secondary | ICD-10-CM | POA: Diagnosis not present

## 2023-08-22 DIAGNOSIS — F419 Anxiety disorder, unspecified: Secondary | ICD-10-CM | POA: Diagnosis not present

## 2023-08-22 DIAGNOSIS — N400 Enlarged prostate without lower urinary tract symptoms: Secondary | ICD-10-CM | POA: Diagnosis not present

## 2023-08-22 DIAGNOSIS — F1011 Alcohol abuse, in remission: Secondary | ICD-10-CM | POA: Diagnosis not present

## 2023-08-22 DIAGNOSIS — M791 Myalgia, unspecified site: Secondary | ICD-10-CM | POA: Diagnosis not present

## 2023-08-22 DIAGNOSIS — R0981 Nasal congestion: Secondary | ICD-10-CM | POA: Diagnosis not present

## 2023-08-22 DIAGNOSIS — I4891 Unspecified atrial fibrillation: Secondary | ICD-10-CM | POA: Diagnosis not present

## 2023-08-22 DIAGNOSIS — N179 Acute kidney failure, unspecified: Secondary | ICD-10-CM | POA: Diagnosis not present

## 2023-08-23 DIAGNOSIS — I10 Essential (primary) hypertension: Secondary | ICD-10-CM | POA: Diagnosis not present

## 2023-08-23 DIAGNOSIS — E8721 Acute metabolic acidosis: Secondary | ICD-10-CM | POA: Diagnosis not present

## 2023-08-23 DIAGNOSIS — I4891 Unspecified atrial fibrillation: Secondary | ICD-10-CM | POA: Diagnosis not present

## 2023-08-23 DIAGNOSIS — I340 Nonrheumatic mitral (valve) insufficiency: Secondary | ICD-10-CM | POA: Diagnosis not present

## 2023-08-23 DIAGNOSIS — Z8774 Personal history of (corrected) congenital malformations of heart and circulatory system: Secondary | ICD-10-CM | POA: Diagnosis not present

## 2023-08-23 DIAGNOSIS — N179 Acute kidney failure, unspecified: Secondary | ICD-10-CM | POA: Diagnosis not present

## 2023-08-23 DIAGNOSIS — I480 Paroxysmal atrial fibrillation: Secondary | ICD-10-CM | POA: Diagnosis not present

## 2023-09-25 DIAGNOSIS — R0602 Shortness of breath: Secondary | ICD-10-CM | POA: Diagnosis not present

## 2023-09-25 DIAGNOSIS — M19011 Primary osteoarthritis, right shoulder: Secondary | ICD-10-CM | POA: Diagnosis not present

## 2023-09-25 DIAGNOSIS — I4891 Unspecified atrial fibrillation: Secondary | ICD-10-CM | POA: Diagnosis not present

## 2023-09-25 DIAGNOSIS — F10131 Alcohol abuse with withdrawal delirium: Secondary | ICD-10-CM | POA: Diagnosis not present

## 2023-09-25 DIAGNOSIS — I480 Paroxysmal atrial fibrillation: Secondary | ICD-10-CM | POA: Diagnosis not present

## 2023-09-25 DIAGNOSIS — I10 Essential (primary) hypertension: Secondary | ICD-10-CM | POA: Diagnosis not present

## 2023-09-25 DIAGNOSIS — F419 Anxiety disorder, unspecified: Secondary | ICD-10-CM | POA: Diagnosis not present

## 2023-09-25 DIAGNOSIS — M19012 Primary osteoarthritis, left shoulder: Secondary | ICD-10-CM | POA: Diagnosis not present

## 2023-09-25 DIAGNOSIS — Z8673 Personal history of transient ischemic attack (TIA), and cerebral infarction without residual deficits: Secondary | ICD-10-CM | POA: Diagnosis not present

## 2023-09-25 DIAGNOSIS — N400 Enlarged prostate without lower urinary tract symptoms: Secondary | ICD-10-CM | POA: Diagnosis not present

## 2023-09-25 DIAGNOSIS — Z888 Allergy status to other drugs, medicaments and biological substances status: Secondary | ICD-10-CM | POA: Diagnosis not present

## 2023-09-25 DIAGNOSIS — F10239 Alcohol dependence with withdrawal, unspecified: Secondary | ICD-10-CM | POA: Diagnosis not present

## 2023-09-25 DIAGNOSIS — G459 Transient cerebral ischemic attack, unspecified: Secondary | ICD-10-CM | POA: Diagnosis not present

## 2023-09-26 DIAGNOSIS — F10239 Alcohol dependence with withdrawal, unspecified: Secondary | ICD-10-CM | POA: Diagnosis not present

## 2023-09-26 DIAGNOSIS — I10 Essential (primary) hypertension: Secondary | ICD-10-CM | POA: Diagnosis not present

## 2023-09-26 DIAGNOSIS — N400 Enlarged prostate without lower urinary tract symptoms: Secondary | ICD-10-CM | POA: Diagnosis not present

## 2023-09-26 DIAGNOSIS — I480 Paroxysmal atrial fibrillation: Secondary | ICD-10-CM | POA: Diagnosis not present

## 2023-09-27 DIAGNOSIS — I639 Cerebral infarction, unspecified: Secondary | ICD-10-CM | POA: Diagnosis not present

## 2023-09-27 DIAGNOSIS — I480 Paroxysmal atrial fibrillation: Secondary | ICD-10-CM | POA: Diagnosis not present

## 2023-09-27 DIAGNOSIS — I10 Essential (primary) hypertension: Secondary | ICD-10-CM | POA: Diagnosis not present

## 2023-09-27 DIAGNOSIS — I4891 Unspecified atrial fibrillation: Secondary | ICD-10-CM | POA: Diagnosis not present

## 2023-09-27 DIAGNOSIS — G459 Transient cerebral ischemic attack, unspecified: Secondary | ICD-10-CM | POA: Diagnosis not present

## 2023-09-27 DIAGNOSIS — F10239 Alcohol dependence with withdrawal, unspecified: Secondary | ICD-10-CM | POA: Diagnosis not present

## 2023-09-27 DIAGNOSIS — R0602 Shortness of breath: Secondary | ICD-10-CM | POA: Diagnosis not present

## 2023-09-27 DIAGNOSIS — N400 Enlarged prostate without lower urinary tract symptoms: Secondary | ICD-10-CM | POA: Diagnosis not present

## 2023-09-27 DIAGNOSIS — R410 Disorientation, unspecified: Secondary | ICD-10-CM | POA: Diagnosis not present

## 2023-09-28 DIAGNOSIS — Z79899 Other long term (current) drug therapy: Secondary | ICD-10-CM | POA: Diagnosis not present

## 2023-09-28 DIAGNOSIS — I10 Essential (primary) hypertension: Secondary | ICD-10-CM | POA: Diagnosis not present

## 2023-09-28 DIAGNOSIS — I639 Cerebral infarction, unspecified: Secondary | ICD-10-CM | POA: Diagnosis not present

## 2023-09-28 DIAGNOSIS — E7800 Pure hypercholesterolemia, unspecified: Secondary | ICD-10-CM | POA: Diagnosis not present

## 2023-09-28 DIAGNOSIS — W19XXXA Unspecified fall, initial encounter: Secondary | ICD-10-CM | POA: Diagnosis not present

## 2023-09-28 DIAGNOSIS — R410 Disorientation, unspecified: Secondary | ICD-10-CM | POA: Diagnosis not present

## 2023-09-28 DIAGNOSIS — R4182 Altered mental status, unspecified: Secondary | ICD-10-CM | POA: Diagnosis not present

## 2023-09-28 DIAGNOSIS — Z7901 Long term (current) use of anticoagulants: Secondary | ICD-10-CM | POA: Diagnosis not present

## 2023-09-28 DIAGNOSIS — I4891 Unspecified atrial fibrillation: Secondary | ICD-10-CM | POA: Diagnosis not present

## 2023-09-28 DIAGNOSIS — G459 Transient cerebral ischemic attack, unspecified: Secondary | ICD-10-CM | POA: Diagnosis not present

## 2023-10-01 DIAGNOSIS — F10239 Alcohol dependence with withdrawal, unspecified: Secondary | ICD-10-CM | POA: Diagnosis not present

## 2023-10-01 DIAGNOSIS — Z7982 Long term (current) use of aspirin: Secondary | ICD-10-CM | POA: Diagnosis not present

## 2023-10-01 DIAGNOSIS — R531 Weakness: Secondary | ICD-10-CM | POA: Diagnosis not present

## 2023-10-01 DIAGNOSIS — N400 Enlarged prostate without lower urinary tract symptoms: Secondary | ICD-10-CM | POA: Diagnosis not present

## 2023-10-01 DIAGNOSIS — I4891 Unspecified atrial fibrillation: Secondary | ICD-10-CM | POA: Diagnosis not present

## 2023-10-01 DIAGNOSIS — Z7901 Long term (current) use of anticoagulants: Secondary | ICD-10-CM | POA: Diagnosis not present

## 2023-10-01 DIAGNOSIS — I10 Essential (primary) hypertension: Secondary | ICD-10-CM | POA: Diagnosis not present

## 2023-10-01 DIAGNOSIS — F10231 Alcohol dependence with withdrawal delirium: Secondary | ICD-10-CM | POA: Diagnosis not present

## 2023-10-01 DIAGNOSIS — R29898 Other symptoms and signs involving the musculoskeletal system: Secondary | ICD-10-CM | POA: Diagnosis not present

## 2023-10-01 DIAGNOSIS — G459 Transient cerebral ischemic attack, unspecified: Secondary | ICD-10-CM | POA: Diagnosis not present

## 2023-10-01 DIAGNOSIS — Z79899 Other long term (current) drug therapy: Secondary | ICD-10-CM | POA: Diagnosis not present

## 2023-10-01 DIAGNOSIS — Z7902 Long term (current) use of antithrombotics/antiplatelets: Secondary | ICD-10-CM | POA: Diagnosis not present

## 2023-10-01 DIAGNOSIS — R410 Disorientation, unspecified: Secondary | ICD-10-CM | POA: Diagnosis not present

## 2023-10-01 DIAGNOSIS — G9389 Other specified disorders of brain: Secondary | ICD-10-CM | POA: Diagnosis not present

## 2023-10-01 DIAGNOSIS — I480 Paroxysmal atrial fibrillation: Secondary | ICD-10-CM | POA: Diagnosis not present

## 2023-10-02 DIAGNOSIS — I4891 Unspecified atrial fibrillation: Secondary | ICD-10-CM | POA: Diagnosis not present

## 2023-10-02 DIAGNOSIS — G459 Transient cerebral ischemic attack, unspecified: Secondary | ICD-10-CM | POA: Diagnosis not present

## 2023-10-02 DIAGNOSIS — N400 Enlarged prostate without lower urinary tract symptoms: Secondary | ICD-10-CM | POA: Diagnosis not present

## 2023-10-02 DIAGNOSIS — R410 Disorientation, unspecified: Secondary | ICD-10-CM | POA: Diagnosis not present

## 2023-10-02 DIAGNOSIS — F10239 Alcohol dependence with withdrawal, unspecified: Secondary | ICD-10-CM | POA: Diagnosis not present

## 2023-10-08 DIAGNOSIS — I480 Paroxysmal atrial fibrillation: Secondary | ICD-10-CM | POA: Diagnosis not present

## 2023-10-08 DIAGNOSIS — F1021 Alcohol dependence, in remission: Secondary | ICD-10-CM | POA: Diagnosis not present

## 2023-10-08 DIAGNOSIS — I679 Cerebrovascular disease, unspecified: Secondary | ICD-10-CM | POA: Diagnosis not present

## 2023-10-08 DIAGNOSIS — N400 Enlarged prostate without lower urinary tract symptoms: Secondary | ICD-10-CM | POA: Diagnosis not present

## 2023-10-08 DIAGNOSIS — F3341 Major depressive disorder, recurrent, in partial remission: Secondary | ICD-10-CM | POA: Diagnosis not present

## 2023-10-08 DIAGNOSIS — R5383 Other fatigue: Secondary | ICD-10-CM | POA: Diagnosis not present

## 2023-10-08 DIAGNOSIS — F419 Anxiety disorder, unspecified: Secondary | ICD-10-CM | POA: Diagnosis not present

## 2023-10-08 DIAGNOSIS — I10 Essential (primary) hypertension: Secondary | ICD-10-CM | POA: Diagnosis not present

## 2023-10-08 DIAGNOSIS — F5104 Psychophysiologic insomnia: Secondary | ICD-10-CM | POA: Diagnosis not present

## 2023-10-15 DIAGNOSIS — F419 Anxiety disorder, unspecified: Secondary | ICD-10-CM | POA: Diagnosis not present

## 2023-10-15 DIAGNOSIS — I480 Paroxysmal atrial fibrillation: Secondary | ICD-10-CM | POA: Diagnosis not present

## 2023-10-15 DIAGNOSIS — E7800 Pure hypercholesterolemia, unspecified: Secondary | ICD-10-CM | POA: Diagnosis not present

## 2023-10-15 DIAGNOSIS — I10 Essential (primary) hypertension: Secondary | ICD-10-CM | POA: Diagnosis not present

## 2023-10-15 DIAGNOSIS — I679 Cerebrovascular disease, unspecified: Secondary | ICD-10-CM | POA: Diagnosis not present

## 2023-10-15 DIAGNOSIS — F3341 Major depressive disorder, recurrent, in partial remission: Secondary | ICD-10-CM | POA: Diagnosis not present

## 2023-10-15 DIAGNOSIS — F1021 Alcohol dependence, in remission: Secondary | ICD-10-CM | POA: Diagnosis not present

## 2023-10-15 DIAGNOSIS — F5104 Psychophysiologic insomnia: Secondary | ICD-10-CM | POA: Diagnosis not present

## 2023-10-15 DIAGNOSIS — N400 Enlarged prostate without lower urinary tract symptoms: Secondary | ICD-10-CM | POA: Diagnosis not present

## 2023-10-28 DIAGNOSIS — F1021 Alcohol dependence, in remission: Secondary | ICD-10-CM | POA: Diagnosis not present

## 2023-10-28 DIAGNOSIS — I10 Essential (primary) hypertension: Secondary | ICD-10-CM | POA: Diagnosis not present

## 2023-10-28 DIAGNOSIS — M25512 Pain in left shoulder: Secondary | ICD-10-CM | POA: Diagnosis not present

## 2023-10-28 DIAGNOSIS — E7800 Pure hypercholesterolemia, unspecified: Secondary | ICD-10-CM | POA: Diagnosis not present

## 2023-10-28 DIAGNOSIS — F3341 Major depressive disorder, recurrent, in partial remission: Secondary | ICD-10-CM | POA: Diagnosis not present

## 2023-10-28 DIAGNOSIS — N400 Enlarged prostate without lower urinary tract symptoms: Secondary | ICD-10-CM | POA: Diagnosis not present

## 2023-10-28 DIAGNOSIS — I480 Paroxysmal atrial fibrillation: Secondary | ICD-10-CM | POA: Diagnosis not present

## 2023-10-28 DIAGNOSIS — F5104 Psychophysiologic insomnia: Secondary | ICD-10-CM | POA: Diagnosis not present

## 2023-10-28 DIAGNOSIS — I679 Cerebrovascular disease, unspecified: Secondary | ICD-10-CM | POA: Diagnosis not present

## 2023-11-05 DIAGNOSIS — M25512 Pain in left shoulder: Secondary | ICD-10-CM | POA: Diagnosis not present

## 2023-11-05 DIAGNOSIS — N400 Enlarged prostate without lower urinary tract symptoms: Secondary | ICD-10-CM | POA: Diagnosis not present

## 2023-11-05 DIAGNOSIS — I480 Paroxysmal atrial fibrillation: Secondary | ICD-10-CM | POA: Diagnosis not present

## 2023-11-05 DIAGNOSIS — F5104 Psychophysiologic insomnia: Secondary | ICD-10-CM | POA: Diagnosis not present

## 2023-11-05 DIAGNOSIS — F1021 Alcohol dependence, in remission: Secondary | ICD-10-CM | POA: Diagnosis not present

## 2023-11-05 DIAGNOSIS — E7800 Pure hypercholesterolemia, unspecified: Secondary | ICD-10-CM | POA: Diagnosis not present

## 2023-11-05 DIAGNOSIS — I679 Cerebrovascular disease, unspecified: Secondary | ICD-10-CM | POA: Diagnosis not present

## 2023-11-05 DIAGNOSIS — I10 Essential (primary) hypertension: Secondary | ICD-10-CM | POA: Diagnosis not present

## 2023-11-05 DIAGNOSIS — F3341 Major depressive disorder, recurrent, in partial remission: Secondary | ICD-10-CM | POA: Diagnosis not present

## 2023-11-29 DIAGNOSIS — I10 Essential (primary) hypertension: Secondary | ICD-10-CM | POA: Diagnosis not present

## 2023-11-29 DIAGNOSIS — Z91199 Patient's noncompliance with other medical treatment and regimen due to unspecified reason: Secondary | ICD-10-CM | POA: Diagnosis not present

## 2023-11-29 DIAGNOSIS — E860 Dehydration: Secondary | ICD-10-CM | POA: Diagnosis not present

## 2023-11-29 DIAGNOSIS — R0603 Acute respiratory distress: Secondary | ICD-10-CM | POA: Diagnosis not present

## 2023-11-29 DIAGNOSIS — Z7901 Long term (current) use of anticoagulants: Secondary | ICD-10-CM | POA: Diagnosis not present

## 2023-11-29 DIAGNOSIS — Z79899 Other long term (current) drug therapy: Secondary | ICD-10-CM | POA: Diagnosis not present

## 2023-11-29 DIAGNOSIS — I498 Other specified cardiac arrhythmias: Secondary | ICD-10-CM | POA: Diagnosis not present

## 2023-11-29 DIAGNOSIS — Z8673 Personal history of transient ischemic attack (TIA), and cerebral infarction without residual deficits: Secondary | ICD-10-CM | POA: Diagnosis not present

## 2023-11-29 DIAGNOSIS — N400 Enlarged prostate without lower urinary tract symptoms: Secondary | ICD-10-CM | POA: Diagnosis not present

## 2023-11-29 DIAGNOSIS — I4891 Unspecified atrial fibrillation: Secondary | ICD-10-CM | POA: Diagnosis not present

## 2023-11-29 DIAGNOSIS — Z7902 Long term (current) use of antithrombotics/antiplatelets: Secondary | ICD-10-CM | POA: Diagnosis not present

## 2023-11-30 DIAGNOSIS — I4891 Unspecified atrial fibrillation: Secondary | ICD-10-CM | POA: Diagnosis not present

## 2023-11-30 DIAGNOSIS — N400 Enlarged prostate without lower urinary tract symptoms: Secondary | ICD-10-CM | POA: Diagnosis not present

## 2023-11-30 DIAGNOSIS — E860 Dehydration: Secondary | ICD-10-CM | POA: Diagnosis not present

## 2023-12-01 DIAGNOSIS — F1020 Alcohol dependence, uncomplicated: Secondary | ICD-10-CM | POA: Diagnosis not present

## 2023-12-01 DIAGNOSIS — Z8774 Personal history of (corrected) congenital malformations of heart and circulatory system: Secondary | ICD-10-CM | POA: Diagnosis not present

## 2023-12-01 DIAGNOSIS — Z131 Encounter for screening for diabetes mellitus: Secondary | ICD-10-CM | POA: Diagnosis not present

## 2023-12-01 DIAGNOSIS — I10 Essential (primary) hypertension: Secondary | ICD-10-CM | POA: Diagnosis not present

## 2023-12-01 DIAGNOSIS — E785 Hyperlipidemia, unspecified: Secondary | ICD-10-CM | POA: Diagnosis not present

## 2023-12-01 DIAGNOSIS — R351 Nocturia: Secondary | ICD-10-CM | POA: Diagnosis not present

## 2023-12-01 DIAGNOSIS — I4891 Unspecified atrial fibrillation: Secondary | ICD-10-CM | POA: Diagnosis not present

## 2023-12-12 DIAGNOSIS — F10129 Alcohol abuse with intoxication, unspecified: Secondary | ICD-10-CM | POA: Diagnosis not present

## 2023-12-12 DIAGNOSIS — Z79899 Other long term (current) drug therapy: Secondary | ICD-10-CM | POA: Diagnosis not present

## 2023-12-12 DIAGNOSIS — I4891 Unspecified atrial fibrillation: Secondary | ICD-10-CM | POA: Diagnosis not present

## 2023-12-12 DIAGNOSIS — R296 Repeated falls: Secondary | ICD-10-CM | POA: Diagnosis not present

## 2023-12-12 DIAGNOSIS — Z043 Encounter for examination and observation following other accident: Secondary | ICD-10-CM | POA: Diagnosis not present

## 2023-12-12 DIAGNOSIS — W19XXXA Unspecified fall, initial encounter: Secondary | ICD-10-CM | POA: Diagnosis not present

## 2023-12-12 DIAGNOSIS — S0990XA Unspecified injury of head, initial encounter: Secondary | ICD-10-CM | POA: Diagnosis not present

## 2023-12-12 DIAGNOSIS — R58 Hemorrhage, not elsewhere classified: Secondary | ICD-10-CM | POA: Diagnosis not present

## 2023-12-12 DIAGNOSIS — Z7901 Long term (current) use of anticoagulants: Secondary | ICD-10-CM | POA: Diagnosis not present

## 2023-12-12 DIAGNOSIS — F10929 Alcohol use, unspecified with intoxication, unspecified: Secondary | ICD-10-CM | POA: Diagnosis not present

## 2023-12-12 DIAGNOSIS — I10 Essential (primary) hypertension: Secondary | ICD-10-CM | POA: Diagnosis not present

## 2023-12-13 DIAGNOSIS — Z20822 Contact with and (suspected) exposure to covid-19: Secondary | ICD-10-CM | POA: Diagnosis not present

## 2023-12-13 DIAGNOSIS — R519 Headache, unspecified: Secondary | ICD-10-CM | POA: Diagnosis not present

## 2023-12-13 DIAGNOSIS — Z5321 Procedure and treatment not carried out due to patient leaving prior to being seen by health care provider: Secondary | ICD-10-CM | POA: Diagnosis not present

## 2023-12-14 DIAGNOSIS — R296 Repeated falls: Secondary | ICD-10-CM | POA: Diagnosis not present

## 2023-12-14 DIAGNOSIS — M5030 Other cervical disc degeneration, unspecified cervical region: Secondary | ICD-10-CM | POA: Diagnosis not present

## 2023-12-14 DIAGNOSIS — G9389 Other specified disorders of brain: Secondary | ICD-10-CM | POA: Diagnosis not present

## 2023-12-14 DIAGNOSIS — K5730 Diverticulosis of large intestine without perforation or abscess without bleeding: Secondary | ICD-10-CM | POA: Diagnosis not present

## 2023-12-14 DIAGNOSIS — M7731 Calcaneal spur, right foot: Secondary | ICD-10-CM | POA: Diagnosis not present

## 2023-12-14 DIAGNOSIS — I700 Atherosclerosis of aorta: Secondary | ICD-10-CM | POA: Diagnosis not present

## 2023-12-14 DIAGNOSIS — I722 Aneurysm of renal artery: Secondary | ICD-10-CM | POA: Diagnosis not present

## 2023-12-14 DIAGNOSIS — R918 Other nonspecific abnormal finding of lung field: Secondary | ICD-10-CM | POA: Diagnosis not present

## 2023-12-14 DIAGNOSIS — S301XXA Contusion of abdominal wall, initial encounter: Secondary | ICD-10-CM | POA: Diagnosis not present

## 2023-12-14 DIAGNOSIS — T148XXA Other injury of unspecified body region, initial encounter: Secondary | ICD-10-CM | POA: Diagnosis not present

## 2023-12-14 DIAGNOSIS — K760 Fatty (change of) liver, not elsewhere classified: Secondary | ICD-10-CM | POA: Diagnosis not present

## 2023-12-14 DIAGNOSIS — F1010 Alcohol abuse, uncomplicated: Secondary | ICD-10-CM | POA: Diagnosis not present

## 2023-12-14 DIAGNOSIS — N400 Enlarged prostate without lower urinary tract symptoms: Secondary | ICD-10-CM | POA: Diagnosis not present

## 2023-12-14 DIAGNOSIS — S0990XA Unspecified injury of head, initial encounter: Secondary | ICD-10-CM | POA: Diagnosis not present

## 2023-12-14 DIAGNOSIS — K4020 Bilateral inguinal hernia, without obstruction or gangrene, not specified as recurrent: Secondary | ICD-10-CM | POA: Diagnosis not present

## 2023-12-14 DIAGNOSIS — Z043 Encounter for examination and observation following other accident: Secondary | ICD-10-CM | POA: Diagnosis not present

## 2023-12-14 DIAGNOSIS — F1020 Alcohol dependence, uncomplicated: Secondary | ICD-10-CM | POA: Diagnosis not present

## 2023-12-14 DIAGNOSIS — M4802 Spinal stenosis, cervical region: Secondary | ICD-10-CM | POA: Diagnosis not present

## 2023-12-14 DIAGNOSIS — K4090 Unilateral inguinal hernia, without obstruction or gangrene, not specified as recurrent: Secondary | ICD-10-CM | POA: Diagnosis not present

## 2023-12-15 DIAGNOSIS — F1020 Alcohol dependence, uncomplicated: Secondary | ICD-10-CM | POA: Diagnosis not present

## 2023-12-15 DIAGNOSIS — S301XXA Contusion of abdominal wall, initial encounter: Secondary | ICD-10-CM | POA: Diagnosis not present

## 2023-12-15 DIAGNOSIS — F1010 Alcohol abuse, uncomplicated: Secondary | ICD-10-CM | POA: Diagnosis not present

## 2023-12-15 DIAGNOSIS — S0990XA Unspecified injury of head, initial encounter: Secondary | ICD-10-CM | POA: Diagnosis not present

## 2023-12-15 DIAGNOSIS — R296 Repeated falls: Secondary | ICD-10-CM | POA: Diagnosis not present

## 2023-12-15 DIAGNOSIS — K4090 Unilateral inguinal hernia, without obstruction or gangrene, not specified as recurrent: Secondary | ICD-10-CM | POA: Diagnosis not present

## 2023-12-15 DIAGNOSIS — I722 Aneurysm of renal artery: Secondary | ICD-10-CM | POA: Diagnosis not present

## 2024-02-18 ENCOUNTER — Other Ambulatory Visit: Payer: Self-pay

## 2024-02-18 ENCOUNTER — Emergency Department (HOSPITAL_COMMUNITY)

## 2024-02-18 ENCOUNTER — Emergency Department (HOSPITAL_COMMUNITY)
Admission: EM | Admit: 2024-02-18 | Discharge: 2024-02-19 | Disposition: A | Discharge status: Home / Self Care | Attending: Emergency Medicine | Admitting: Emergency Medicine

## 2024-02-18 ENCOUNTER — Encounter (HOSPITAL_COMMUNITY): Payer: Self-pay | Admitting: Emergency Medicine

## 2024-02-18 DIAGNOSIS — R4182 Altered mental status, unspecified: Secondary | ICD-10-CM | POA: Diagnosis present

## 2024-02-18 DIAGNOSIS — I10 Essential (primary) hypertension: Secondary | ICD-10-CM | POA: Insufficient documentation

## 2024-02-18 DIAGNOSIS — R413 Other amnesia: Secondary | ICD-10-CM | POA: Insufficient documentation

## 2024-02-18 DIAGNOSIS — R42 Dizziness and giddiness: Secondary | ICD-10-CM | POA: Insufficient documentation

## 2024-02-18 LAB — CBC WITH DIFFERENTIAL/PLATELET
Abs Immature Granulocytes: 0.01 10*3/uL (ref 0.00–0.07)
Basophils Absolute: 0 10*3/uL (ref 0.0–0.1)
Basophils Relative: 0 %
Eosinophils Absolute: 0.2 10*3/uL (ref 0.0–0.5)
Eosinophils Relative: 3 %
HCT: 45 % (ref 39.0–52.0)
Hemoglobin: 15.7 g/dL (ref 13.0–17.0)
Immature Granulocytes: 0 %
Lymphocytes Relative: 28 %
Lymphs Abs: 2 10*3/uL (ref 0.7–4.0)
MCH: 33.5 pg (ref 26.0–34.0)
MCHC: 34.9 g/dL (ref 30.0–36.0)
MCV: 96.2 fL (ref 80.0–100.0)
Monocytes Absolute: 1.2 10*3/uL — ABNORMAL HIGH (ref 0.1–1.0)
Monocytes Relative: 17 %
Neutro Abs: 3.6 10*3/uL (ref 1.7–7.7)
Neutrophils Relative %: 52 %
Platelets: 395 10*3/uL (ref 150–400)
RBC: 4.68 MIL/uL (ref 4.22–5.81)
RDW: 12.5 % (ref 11.5–15.5)
WBC: 6.9 10*3/uL (ref 4.0–10.5)
nRBC: 0 % (ref 0.0–0.2)

## 2024-02-18 LAB — COMPREHENSIVE METABOLIC PANEL WITH GFR
ALT: 19 U/L (ref 0–44)
AST: 28 U/L (ref 15–41)
Albumin: 3.9 g/dL (ref 3.5–5.0)
Alkaline Phosphatase: 55 U/L (ref 38–126)
Anion gap: 9 (ref 5–15)
BUN: 18 mg/dL (ref 8–23)
CO2: 24 mmol/L (ref 22–32)
Calcium: 9.6 mg/dL (ref 8.9–10.3)
Chloride: 100 mmol/L (ref 98–111)
Creatinine, Ser: 1.11 mg/dL (ref 0.61–1.24)
GFR, Estimated: >60 mL/min (ref >60)
Glucose, Bld: 95 mg/dL (ref 70–99)
Potassium: 3.7 mmol/L (ref 3.5–5.1)
Sodium: 133 mmol/L — ABNORMAL LOW (ref 135–145)
Total Bilirubin: 0.8 mg/dL (ref 0.0–1.2)
Total Protein: 7 g/dL (ref 6.5–8.1)

## 2024-02-18 LAB — URINALYSIS, W/ REFLEX TO CULTURE (INFECTION SUSPECTED)
Bilirubin Urine: NEGATIVE
Glucose, UA: NEGATIVE mg/dL
Hgb urine dipstick: NEGATIVE
Ketones, ur: 5 mg/dL — AB
Leukocytes,Ua: NEGATIVE
Nitrite: NEGATIVE
Protein, ur: NEGATIVE mg/dL
Specific Gravity, Urine: 1.01 (ref 1.005–1.030)
pH: 7 (ref 5.0–8.0)

## 2024-02-18 LAB — RESP PANEL BY RT-PCR (RSV, FLU A&B, COVID)  RVPGX2
Influenza A by PCR: NEGATIVE
Influenza B by PCR: NEGATIVE
Resp Syncytial Virus by PCR: NEGATIVE
SARS Coronavirus 2 by RT PCR: NEGATIVE

## 2024-02-18 LAB — RAPID URINE DRUG SCREEN, HOSP PERFORMED
Amphetamines: NOT DETECTED
Barbiturates: NOT DETECTED
Benzodiazepines: NOT DETECTED
Cocaine: NOT DETECTED
Opiates: NOT DETECTED
Tetrahydrocannabinol: NOT DETECTED

## 2024-02-18 LAB — MAGNESIUM: Magnesium: 1.7 mg/dL (ref 1.7–2.4)

## 2024-02-18 LAB — CBG MONITORING, ED: Glucose-Capillary: 92 mg/dL (ref 70–99)

## 2024-02-18 NOTE — ED Provider Notes (Signed)
 [place] EMERGENCY DEPARTMENT AT [facility] Provider Note   CSN: [id number] Arrival date & time: 02/18/24  2029     History  Chief Complaint  Patient presents with   Altered Mental Status    Marcus Wise is a 78 y.o. male with PMH as listed below who presents  BIB EMS from the UC with c/o confusion. Per EMS, pt was diagnosed with a severe concussion in December 2024 after falling on the ice. Stayed in hospital in [place] for 3 days. EMS states that he went to UC today due to him getting lost and forgetting how to get to the post office or his daughter's address. Has noticed something similar for approximately one week but today was the worst incidence. Per EMS, A&O x 4, but continues to repeat questions like what the paramedics name was 6 times. Did not have a bleed in his brain that he knows of. Denies blurry vision but endorses feeling unsteady. Denies room spinning. Endorses lightheadedness periodically. Only takes ramipril. Uses alcohol periodically and denies illicit drugs. Did drink one beer at lunch today. Normally is a runner, and hasn't been able to run since he fell.   Patient denies f/c, flu-like sxs, cough, SOB, CP, abd pain, N/V/D, urinary sxs.   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 Authorizing Provider  diclofenac Sodium (VOLTAREN) 1 % GEL Apply topically 4 (four) times daily.    [provider]  Multiple Vitamin (MULTIVITAMIN) tablet Take 1 tablet by mouth daily.    [provider]  Probiotic Product (PROBIOTIC DAILY PO) Take by mouth.    [provider]  QUEtiapine (SEROQUEL) 25 MG tablet Take 25 mg by mouth at bedtime.    [provider]  ramipril (ALTACE) 10 MG capsule Take 10 mg by mouth daily.   09/16/14   [provider]  tamsulosin (FLOMAX) 0.4 MG CAPS capsule Take 0.4 mg by mouth.    [provider]      Allergies    Statins    Review of Systems   Review of Systems A 10 point review of systems was performed and is negative unless otherwise reported in HPI.  Physical Exam Updated Vital Signs BP (!) 152/97   Pulse 76   Temp 97.9 F (36.6 C) (Oral)   Resp (!) 36   Ht 5' 9.5" (1.765 m)   Wt 78 kg   SpO2 99%   BMI 25.04 kg/m  Physical Exam General: Normal appearing male, lying in bed.  HEENT: NCAT, PERRLA, EOMI, no nystagmus, Sclera anicteric, MMM, trachea midline. Tongue protrudes midline Cardiology: RRR, no murmurs/rubs/gallops. Marland Kitchen  Resp: Normal respiratory rate and effort. CTAB, no wheezes, rhonchi, crackles.  Abd: Soft, non-tender, non-distended. No rebound tenderness or guarding.  GU: Deferred. MSK: No peripheral edema or signs of trauma. Extremities without deformity or TTP. No cyanosis or clubbing. Skin: warm, dry. No rashes or lesions. Back: No CVA tenderness Neuro: A&Ox4, CNs II-XII grossly intact. 5/5 strength all extremities. Sensation grossly intact. Normal speech.  Psych: Normal mood and affect.   1a  Level of consciousness: 0=alert; keenly responsive  1b. LOC questions:  0=Performs both tasks correctly  1c. LOC commands: 0=Performs both tasks correctly  2.  Best Gaze: 0=normal  3.  Visual: 0=No visual loss  4. Facial Palsy: 0=Normal symmetric movement  5a.  Motor left arm: 0=No drift, limb holds 90 (or 45) degrees for full 10 seconds  5b.  Motor right arm: 0=No drift, limb holds 90 (or 45) degrees for full 10 seconds  6a. motor left leg: 0=No drift, limb holds 90 (or 45) degrees for full 10 seconds  6b  Motor right leg:  0=No drift, limb holds 90 (or 45) degrees for full 10 seconds  7. Limb Ataxia: 0=Absent  8.  Sensory: 0=Normal; no sensory loss  9. Best Language:  0=No aphasia, normal  10. Dysarthria: 0=Normal  11. Extinction  and Inattention: 0=No abnormality   Total:   0        ED Results / Procedures / Treatments   Labs (all labs ordered are listed, but only abnormal results are displayed) Labs Reviewed  CBC WITH DIFFERENTIAL/PLATELET - Abnormal; Notable for the following components:      Result Value   Monocytes Absolute 1.2 (*)    All other components within normal limits  COMPREHENSIVE METABOLIC PANEL WITH GFR - Abnormal; Notable for the following components:   Sodium 133 (*)    All other components within normal limits  URINALYSIS, W/ REFLEX TO CULTURE (INFECTION SUSPECTED) - Abnormal; Notable for the following components:   Color, Urine STRAW (*)    Ketones, ur 5 (*)    Bacteria, UA RARE (*)    All other components within normal limits  RESP PANEL BY RT-PCR (RSV, FLU A&B, COVID)  RVPGX2  MAGNESIUM  RAPID URINE DRUG SCREEN, HOSP PERFORMED  ETHANOL  CBG MONITORING, ED    EKG EKG Interpretation Date/Time:  Thursday February 18 2024 20:58:16 EDT Ventricular Rate:  59 PR Interval:  154 QRS Duration:  112 QT Interval:  450 QTC Calculation: 446 R Axis:   -13  Text Interpretation: Sinus rhythm Borderline intraventricular conduction delay RSR' in V1 or V2, probably normal variant Confirmed by Vivi Barrack [phone]) on 02/18/2024 9:44:04 PM  Radiology CT Head Wo Contrast Result Date: 02/18/2024 CLINICAL DATA:  Mental status change of unknown cause. Confusion. Diagnosed with severe concussion in December. EXAM: CT HEAD WITHOUT CONTRAST TECHNIQUE: Contiguous axial images were obtained from the base of the skull through the vertex without intravenous contrast. RADIATION DOSE REDUCTION: This exam was performed according to the departmental dose-optimization program which includes automated exposure control, adjustment of the mA and/or kV according to patient size and/or use of iterative reconstruction technique. COMPARISON:  MRI brain 01/28/2024.  CT head 01/27/2024 FINDINGS: Brain: Focal area of  encephalomalacia in the right anterior temporal lobe likely representing old infarct. No change. Diffuse cerebral atrophy. Ventricular dilatation consistent with central atrophy. Low-attenuation changes in the deep white matter consistent with small vessel ischemia. No abnormal extra-axial fluid collections. No mass effect or midline shift. Gray-white matter junctions are distinct. Basal cisterns are not effaced. No acute intracranial hemorrhage. Vascular: No hyperdense vessel or unexpected calcification. Skull: No acute depressed skull fractures. Old nasal bone deformities. Sinuses/Orbits: Mucosal thickening in the paranasal sinuses. No acute air-fluid levels. Mastoid air cells are clear. Other: None. IMPRESSION: 1. No acute intracranial abnormalities. 2. Chronic atrophy and small vessel ischemic changes. 3. 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 Electronically Signed   By: Burman Nieves M.D.   On: 02/18/2024 23:25    Procedures Procedures    Medications Ordered in ED Medications - No data to display  ED Course/  Medical Decision Making/ A&P                          Medical Decision Making Amount and/or Complexity of Data Reviewed Labs: ordered. Decision-making details documented in ED Course. Radiology: ordered. Decision-making details documented in ED Course.    This patient presents to the ED for concern of memory issues today, possible concussion sxs, this involves an extensive number of treatment options, and is a complaint that carries with it a high risk of complications and morbidity.  I considered the following differential and admission for this acute, potentially life threatening condition.   MDM:    Ddx of acute altered mental status or encephalopathy considered but not limited to: -Intracranial abnormalities such as ICH, hydrocephalus, head trauma - repeat CTH neg for any ICH or obvious abnormalities besides known encephalomalacia -Infection - no focal  infectious sxs, no f/c or leukocytosis -Toxic ingestion - doesn't use illicit drugs or any sedating medications. Had only one beer today, doubtful of intoxication. -No Electrolyte abnormalities or hyper/hypoglycemia -Consider CVA given report of feeling unsteady - NIHSS now is 0 -Consider seizure, though no known seizure activity noted -Consider concussion symptoms -Consider early dementia as well   Clinical Course as of 02/19/24 0002  Thu Feb 18, 2024  2123 Glucose-Capillary: 92 [HN]  2318 D/w Dr. Derry Lory, got an MRI brain on 01/28/24 at [facility] that showed no CVA but encephalomalacia. Will look into his chart and get back to me. [HN]  2353 CT Head Wo Contrast 1. No acute intracranial abnormalities. 2. Chronic atrophy and small vessel ischemic changes. 3. 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   [HN]  2357 D/w neurology who recommended brain mri w/o contrast, and if negative can f/u with neurology o/p for f/u and EEG [HN]  Fri Feb 19, 2024  0001 Glucose, CMP, CBC, urine, UDS, unremarkable in context of presentation [HN]    Clinical Course User Index [HN] Loetta Rough, MD    Labs: I Ordered, and personally interpreted labs.  The pertinent results include:  those listed above  Imaging Studies ordered: I ordered imaging studies including CTH, MRI brain wo contrast I independently visualized and interpreted imaging. I agree with the radiologist interpretation  Additional history obtained from chart review.  External records from outside source obtained and reviewed including [place] [facility]  Reevaluation: After the interventions noted above, I reevaluated the patient and found that they have :stayed the same  Social Determinants of Health: Lives independently  Disposition:  Patient is signed out to the oncoming ED physician Dr. Eudelia Bunch who is made aware of her history, presentation, exam, workup, and plan.    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   I have reviewed the patients home medicines and have made adjustments as needed  Problem List / ED Course: Problem List Items Addressed This Visit   None Visit Diagnoses       Memory deficit    -  Primary                   This note was created using dictation software, which may contain spelling or grammatical errors.  Loetta Rough, MD 02/19/24 [phone]

## 2024-02-18 NOTE — ED Triage Notes (Signed)
 Pt BIB EMS from the UC with c/o confusion. Per EMS, pt was diagnosed with a severe concussion in December. EMS states that he went to UC today due to him getting lost and forgetting how to get to the post office or his daughter's address. Pt states that this started this AM. Per EMS, A&O x 4, but continues to repeat questions like what the paramedics name was 6 times. Per ems, the UC provider states that it could be related to the concussion from December.

## 2024-02-19 ENCOUNTER — Emergency Department (HOSPITAL_COMMUNITY)

## 2024-02-19 MED ORDER — LORAZEPAM 1 MG PO TABS
0.5000 mg | ORAL_TABLET | Freq: Once | ORAL | Status: AC
  Administered 2024-02-19: 0.5 mg via ORAL
  Filled 2024-02-19: qty 1

## 2024-02-19 NOTE — ED Notes (Signed)
 Patient transported to MRI

## 2024-02-19 NOTE — ED Provider Notes (Signed)
 I assumed care of this patient from previous provider.  Please see their note for further details of history, exam, and MDM.   Briefly patient is a 78 y.o. male who presented memory issues following concussion several months ago.  Neurologically intact.  CT head reassuring.  Currently awaiting MRI.  Plan to reassess and follow-up imaging.  If MRI is normal, anticipate discharge.  MRI w/o acute changes. Remote strokes noted. Neurology recommended outpatient management and work up including EEG.  The patient appears reasonably screened and/or stabilized for discharge and I doubt any other medical condition or other [facility] requiring further screening, evaluation, or treatment in the ED at this time. I have discussed the findings, Dx and Tx plan with the patient/family who expressed understanding and agree(s) with the plan. Discharge instructions discussed at length. The patient/family was given strict return precautions who verbalized understanding of the instructions. No further questions at time of discharge.  Disposition: Discharge  Condition: Good  ED Discharge Orders     None        Follow Up: Doran Stabler, NP [address] [place] [address] [phone]  Call  to schedule an appointment for close follow up  [facility] [address]     [address] [place] [address] [phone] Call  to schedule an appointment for close follow up for outpatient management of stroke and seizure work up (EEG).         Nira Conn, MD 02/19/24 [phone]

## 2024-07-27 DIAGNOSIS — E663 Overweight: Secondary | ICD-10-CM | POA: Diagnosis not present

## 2024-07-27 DIAGNOSIS — G4700 Insomnia, unspecified: Secondary | ICD-10-CM | POA: Diagnosis not present

## 2024-07-27 DIAGNOSIS — Z6826 Body mass index (BMI) 26.0-26.9, adult: Secondary | ICD-10-CM | POA: Diagnosis not present

## 2024-07-27 DIAGNOSIS — I4891 Unspecified atrial fibrillation: Secondary | ICD-10-CM | POA: Diagnosis not present

## 2024-07-27 DIAGNOSIS — F1020 Alcohol dependence, uncomplicated: Secondary | ICD-10-CM | POA: Diagnosis not present

## 2024-07-27 DIAGNOSIS — R531 Weakness: Secondary | ICD-10-CM | POA: Diagnosis not present

## 2024-07-27 DIAGNOSIS — I10 Essential (primary) hypertension: Secondary | ICD-10-CM | POA: Diagnosis not present

## 2024-07-27 DIAGNOSIS — Z79899 Other long term (current) drug therapy: Secondary | ICD-10-CM | POA: Diagnosis not present

## 2024-07-29 ENCOUNTER — Other Ambulatory Visit: Payer: Self-pay

## 2024-07-29 ENCOUNTER — Encounter (HOSPITAL_COMMUNITY): Payer: Self-pay

## 2024-07-29 ENCOUNTER — Emergency Department (HOSPITAL_COMMUNITY)
Admission: EM | Admit: 2024-07-29 | Discharge: 2024-07-29 | Disposition: A | Discharge status: Home / Self Care | Attending: Emergency Medicine | Admitting: Emergency Medicine

## 2024-07-29 DIAGNOSIS — E871 Hypo-osmolality and hyponatremia: Secondary | ICD-10-CM | POA: Insufficient documentation

## 2024-07-29 DIAGNOSIS — E875 Hyperkalemia: Secondary | ICD-10-CM | POA: Diagnosis not present

## 2024-07-29 DIAGNOSIS — I10 Essential (primary) hypertension: Secondary | ICD-10-CM | POA: Insufficient documentation

## 2024-07-29 LAB — BASIC METABOLIC PANEL WITH GFR
Anion gap: 13 (ref 5–15)
BUN: 16 mg/dL (ref 8–23)
CO2: 22 mmol/L (ref 22–32)
Calcium: 9.2 mg/dL (ref 8.9–10.3)
Chloride: 100 mmol/L (ref 98–111)
Creatinine, Ser: 1.21 mg/dL (ref 0.61–1.24)
GFR, Estimated: >60 mL/min (ref >60)
Glucose, Bld: 87 mg/dL (ref 70–99)
Potassium: 3.9 mmol/L (ref 3.5–5.1)
Sodium: 135 mmol/L (ref 135–145)

## 2024-07-29 LAB — I-STAT CHEM 8, ED
BUN: 24 mg/dL — ABNORMAL HIGH (ref 8–23)
Calcium, Ion: 1.05 mmol/L — ABNORMAL LOW (ref 1.15–1.40)
Chloride: 104 mmol/L (ref 98–111)
Creatinine, Ser: 1.4 mg/dL — ABNORMAL HIGH (ref 0.61–1.24)
Glucose, Bld: 90 mg/dL (ref 70–99)
HCT: 47 % (ref 39.0–52.0)
Hemoglobin: 16 g/dL (ref 13.0–17.0)
Potassium: 8.1 mmol/L (ref 3.5–5.1)
Sodium: 133 mmol/L — ABNORMAL LOW (ref 135–145)
TCO2: 23 mmol/L (ref 22–32)

## 2024-07-29 LAB — CBC WITH DIFFERENTIAL/PLATELET
Abs Immature Granulocytes: 0.01 K/uL (ref 0.00–0.07)
Basophils Absolute: 0 K/uL (ref 0.0–0.1)
Basophils Relative: 1 %
Eosinophils Absolute: 0.1 K/uL (ref 0.0–0.5)
Eosinophils Relative: 1 %
HCT: 43.3 % (ref 39.0–52.0)
Hemoglobin: 15.2 g/dL (ref 13.0–17.0)
Immature Granulocytes: 0 %
Lymphocytes Relative: 22 %
Lymphs Abs: 1.3 K/uL (ref 0.7–4.0)
MCH: 34 pg (ref 26.0–34.0)
MCHC: 35.1 g/dL (ref 30.0–36.0)
MCV: 96.9 fL (ref 80.0–100.0)
Monocytes Absolute: 0.8 K/uL (ref 0.1–1.0)
Monocytes Relative: 14 %
Neutro Abs: 4 K/uL (ref 1.7–7.7)
Neutrophils Relative %: 62 %
Platelets: UNDETERMINED K/uL (ref 150–400)
RBC: 4.47 MIL/uL (ref 4.22–5.81)
RDW: 13.4 % (ref 11.5–15.5)
WBC: 6.2 K/uL (ref 4.0–10.5)
nRBC: 0 % (ref 0.0–0.2)

## 2024-07-29 NOTE — Discharge Instructions (Signed)
 You were seen in the emergency department for hypertension. while you were here we did a physical exam, labs and EKG which were all reassuring.  Please follow-up with your primary care doctor for reevaluation and please continue to take your home blood pressure medications as prescribed.  Come back to the emergency department if you develop chest pain, shortness of breath, severe headache, vision changes or if you have any other reason to believe you need emergency care

## 2024-07-29 NOTE — ED Triage Notes (Signed)
 Patient BIB EMS from home after calling his PCP and his PCP advised him to come to the ED. Patient has been having hypertension and his doctor told him that if his blood pressure was still high on Friday to give him a call, so he did that today and is now here. Patient does have significant cardiac history. Patient did take his blood pressure medication this morning.

## 2024-07-29 NOTE — ED Provider Notes (Signed)
 [place] EMERGENCY DEPARTMENT AT [facility] Provider Note HPI Marcus Wise is a 78 y.o. 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  Patient states that he has been on ramipril  for many years and he has been doing well with this.  He has noticed that his blood pressure has been slowly creeping up.  He has some leftover amlodipine and he took a dose this morning after he noticed his blood pressure to be in the 160s.  He has no headache, vision changes, abdominal pain, chest pain, shortness of breath, nausea, vomiting.  He feels generalized weakness but no focal weakness.  Patient states that he has some numbness in his left hand that has been going on for years due to a left shoulder injury from playing sports when he was younger.  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    Hypertension    Past Surgical History:  Procedure Laterality Date   ATRIAL SEPTAL DEFECT(ASD) CLOSURE N/A 05/13/2018   Procedure: ATRIAL SEPTAL DEFECT (ASD) CLOSURE;  Surgeon: Wonda Sharper, MD;  Location: [facility] INVASIVE CV LAB;  Service: Cardiovascular;  Laterality: N/A;   BACK SURGERY     KNEE SURGERY     SPLENECTOMY      Review of Systems Pertinent positives and negative findings are listed as part of the History of Present Illness and MDM  Physical Exam Vitals:   07/29/24 1414 07/29/24 1445 07/29/24 1500 07/29/24 1630  BP:  136/83 135/88 (!) 155/92  Pulse:  65 68 81  Resp:  15 17 17   SpO2:  97% 97% 100%  Weight: 77.1 kg     Height: 5' 9.5 (1.765 m)        Constitutional Nursing notes reviewed Vital signs reviewed  HEENT No obvious trauma Pupils round, equal, and reactive to light. Pupils cross midline Neck supple 20/20 vision  Respiratory Effort  normal Breathing well on room air CTAB  CV Normal rate and rhythm  No murmurs No pitting edema  Abdomen Soft, non-tender, non-distended No peritonitis  MSK Atraumatic No obvious deformity ROM appropriate  Neuro Cranial nerves II through XII intact 5/5 symmetric strength in bilateral upper and lower extremities Sensation to light touch intact    MDM:  Initial Differential Diagnoses includes asymptomatic hypertension, hypertensive urgency, hypertensive emergency, electrolyte abnormality  I reviewed the patient's vitals, the nursing triage note and evaluated the patient at bedside.  He is presenting with asymptomatic hypertension.  Physical exam reassuring as detailed above.  He has 20/20 vision.  He has no headache, chest pain or shortness of breath.  He has no evidence of volume overload on exam  I reviewed the patient's external records which show that the patient has a history of hypertension on ramipril   Blood pressure on my evaluation is 130s over 80s.  Screening labs obtained along with an EKG.  I reviewed the EKG myself which shows normal sinus rhythm with no evidence of new ischemic changes or high-grade conduction block.  No evidence of new onset arrhythmia.  The PR, QRS and QT intervals are all within normal limits.  Labs show no evidence of endorgan damage.  Specifically, no evidence of AKI or significant electrolyte abnormality.  There is no evidence of leukocytosis or anemia  Given asymptomatic hypertension  with no evidence of endorgan damage, patient discharged home with outpatient follow-up.   Procedures: Procedures  Medications administered in the ED: Medications - No data to display   Impression: 1. Hypertension, unspecified type      Patient's presentation is most consistent with exacerbation of chronic illness.  Disposition: ED Disposition:  Discharge   Discharge: Patient is felt to be medically appropriate for discharge at this time. Patient was  instructed to follow up with their primary care doctor/specialists listed above for re-evaluation. Patient was given strict return precautions.  ED Discharge Orders     None             Dionisio Blunt, MD 07/29/24 2029    Randol Simmonds, MD 07/30/24 1100

## 2024-08-03 DIAGNOSIS — F1020 Alcohol dependence, uncomplicated: Secondary | ICD-10-CM | POA: Diagnosis not present

## 2024-08-03 DIAGNOSIS — E875 Hyperkalemia: Secondary | ICD-10-CM | POA: Diagnosis not present

## 2024-08-03 DIAGNOSIS — I10 Essential (primary) hypertension: Secondary | ICD-10-CM | POA: Diagnosis not present

## 2024-08-10 DIAGNOSIS — F1020 Alcohol dependence, uncomplicated: Secondary | ICD-10-CM | POA: Diagnosis not present

## 2024-08-10 DIAGNOSIS — E663 Overweight: Secondary | ICD-10-CM | POA: Diagnosis not present

## 2024-08-10 DIAGNOSIS — I10 Essential (primary) hypertension: Secondary | ICD-10-CM | POA: Diagnosis not present

## 2024-08-10 DIAGNOSIS — Z6826 Body mass index (BMI) 26.0-26.9, adult: Secondary | ICD-10-CM | POA: Diagnosis not present

## 2024-08-12 ENCOUNTER — Ambulatory Visit: Admitting: Cardiovascular Disease

## 2024-08-12 DIAGNOSIS — R5382 Chronic fatigue, unspecified: Secondary | ICD-10-CM | POA: Diagnosis not present

## 2024-08-12 DIAGNOSIS — Z79899 Other long term (current) drug therapy: Secondary | ICD-10-CM | POA: Diagnosis not present

## 2024-08-12 DIAGNOSIS — I10 Essential (primary) hypertension: Secondary | ICD-10-CM | POA: Diagnosis not present

## 2024-08-12 DIAGNOSIS — E7800 Pure hypercholesterolemia, unspecified: Secondary | ICD-10-CM | POA: Diagnosis not present

## 2024-08-12 DIAGNOSIS — Z8673 Personal history of transient ischemic attack (TIA), and cerebral infarction without residual deficits: Secondary | ICD-10-CM | POA: Diagnosis not present

## 2024-08-12 DIAGNOSIS — I4891 Unspecified atrial fibrillation: Secondary | ICD-10-CM | POA: Diagnosis not present

## 2024-08-15 ENCOUNTER — Ambulatory Visit: Attending: Physician Assistant | Admitting: Physician Assistant

## 2024-08-15 ENCOUNTER — Encounter: Payer: Self-pay | Admitting: Physician Assistant

## 2024-08-15 VITALS — BP 124/72 | HR 72 | Ht 69.5 in | Wt 180.4 lb

## 2024-08-15 DIAGNOSIS — I10 Essential (primary) hypertension: Secondary | ICD-10-CM | POA: Diagnosis not present

## 2024-08-15 NOTE — Patient Instructions (Signed)
 Medication Instructions:  Your physician recommends that you continue on your current medications as directed. Please refer to the Current Medication list given to you today.  *If you need a refill on your cardiac medications before your next appointment, please call your pharmacy*  Lab Work: NONE ordered at this time of appointment   Testing/Procedures: NONE ordered at this time of appointment    Follow-Up: At [facility], you and your health needs are our priority.  As part of our continuing mission to provide you with exceptional heart care, our providers are all part of one team.  This team includes your primary Cardiologist (physician) and Advanced Practice Providers or APPs (Physician Assistants and Nurse Practitioners) who all work together to provide you with the care you need, when you need it.  Your next appointment:   1 year(s)  Provider:   Ozell Fell, MD or Orren Fabry PA    We recommend signing up for the patient portal called MyChart.  Sign up information is provided on this After Visit Summary.  MyChart is used to connect with patients for Virtual Visits (Telemedicine).  Patients are able to view lab/test results, encounter notes, upcoming appointments, etc.  Non-urgent messages can be sent to your provider as well.   To learn more about what you can do with MyChart, go to [email or web address].

## 2024-08-15 NOTE — Progress Notes (Signed)
 Cardiology Office Note   Date:  08/15/2024  ID:  Camil, Wilhelmsen Mar 20, 1946, MRN [id number] PCP: Allen, [place], NP  [facility] Providers Cardiologist:  Ozell Fell, MD   History of Present Illness Marcus Wise is a 78 y.o. 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  He is now here for follow-up visit.  Had been on ramipril  for many years and had been doing well with this.  Notes his blood pressure has been slowly creeping up.  Had some leftover amlodipine and he took a dose that morning after he noticed his blood pressure was in the 160s.  No headache, vision changes, abdominal pain, chest pain, SOB, nausea, or vomiting at that time.  Did have some generalized weakness and some numbness in his left hand which have been going on for years.  Also has left shoulder injury from playing sports.  No infectious disease symptoms.  Today, he presents with hypertension for management of his blood pressure.  He is currently taking ramipril  10 mg and amlodipine 5 mg daily for hypertension. He previously managed his blood pressure with ramipril  alone but added amlodipine after running out of ramipril . He has discontinued BC powders, suspecting they contributed to blood pressure issues, and now uses regular aspirin  as needed for pain.  He has reduced alcohol intake significantly after increased use following his wife's death. He is committed to maintaining physical fitness, engaging in running, sit-ups, and using a punching bag. He ran approximately 15 miles yesterday and 5 miles today.  He donated a kidney to his wife, who lived 34 years post-transplant. His brother died from carotid artery disease, and he suspects steroid use during college athletics may have contributed to his brother's health issues. He takes Seroquel 25 mg at night  for sleep without next-day grogginess.  Reports no shortness of breath nor dyspnea on exertion. Reports no chest pain, pressure, or tightness. No edema, orthopnea, PND. Reports no palpitations.   Discussed the use of AI scribe software for clinical note transcription with the patient, who gave verbal consent to proceed.  ROS: Pertinent ROS in HPI  Studies Reviewed     Echocardiogram 04/21/2023 IMPRESSIONS     1. Left ventricular ejection fraction, by estimation, is 50 to 55%. Left  ventricular ejection fraction by 3D volume is 55 %. The left ventricle has  low normal function. The left ventricle has no regional wall motion  abnormalities. The left ventricular  internal cavity size was mildly dilated. Left ventricular diastolic  parameters are consistent with Grade I diastolic dysfunction (impaired  relaxation).   2. Right ventricular systolic function is normal. The right ventricular  size is normal. There is normal pulmonary artery systolic pressure. The  estimated right ventricular systolic pressure is 23.2 mmHg.   3. 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   4. The mitral valve is abnormal. Mild mitral valve regurgitation.   5. The aortic valve is tricuspid. Aortic valve regurgitation is not  visualized.   6. Aortic dilatation noted. There is borderline dilatation of the  ascending aorta, measuring 38 mm.   7. The inferior vena cava is normal in size with greater than 50%  respiratory variability, suggesting right atrial pressure of 3 mmHg.   Comparison(s): Changes from prior study are noted. 06/30/2019: LVEF 50%.  FINDINGS   Left Ventricle: Left ventricular ejection fraction, by estimation, is 50  to 55%. Left ventricular ejection fraction by 3D volume is 55 %. The left  ventricle has low normal function. The left ventricle has no regional wall  motion abnormalities. The left  ventricular internal cavity size was mildly  dilated. There is no left  ventricular hypertrophy. Left ventricular diastolic parameters are  consistent with Grade I diastolic dysfunction (impaired relaxation).  Indeterminate filling pressures.   Right Ventricle: The right ventricular size is normal. No increase in  right ventricular wall thickness. Right ventricular systolic function is  normal. There is normal pulmonary artery systolic pressure. The tricuspid  regurgitant velocity is 2.25 m/s, and   with an assumed right atrial pressure of 3 mmHg, the estimated right  ventricular systolic pressure is 23.2 mmHg.   Left Atrium: Left atrial size was normal in size.   Right Atrium: Right atrial size was normal in size.   Pericardium: There is no evidence of pericardial effusion.   Mitral Valve: The mitral valve is abnormal. There is mild calcification of  the anterior and posterior mitral valve leaflet(s). Mild mitral valve  regurgitation.   Tricuspid Valve: The tricuspid valve is grossly normal. Tricuspid valve  regurgitation is mild.   Aortic Valve: The aortic valve is tricuspid. Aortic valve regurgitation is  not visualized.   Pulmonic Valve: The pulmonic valve was normal in structure. Pulmonic valve  regurgitation is not visualized.   Aorta: Aortic dilatation noted. There is borderline dilatation of the  ascending aorta, measuring 38 mm.   Venous: The inferior vena cava is normal in size with greater than 50%  respiratory variability, suggesting right atrial pressure of 3 mmHg.   IAS/Shunts: No atrial level shunt detected by color flow Doppler.       Physical Exam VS:  BP 124/72   Pulse 72   Ht 5' 9.5 (1.765 m)   Wt 180 lb 6.4 oz (81.8 kg)   SpO2 97%   BMI 26.26 kg/m        Wt Readings from Last 3 Encounters:  08/15/24 180 lb 6.4 oz (81.8 kg)  07/29/24 170 lb (77.1 kg)  02/18/24 172 lb (78 kg)    GEN: Well nourished, well developed in no acute distress NECK: No JVD; No carotid bruits CARDIAC: RRR, no  murmurs, rubs, gallops RESPIRATORY:  Clear to auscultation without rales, wheezing or rhonchi  ABDOMEN: Soft, non-tender, non-distended EXTREMITIES:  No edema; No deformity   ASSESSMENT AND PLAN  Essential hypertension Long-standing hypertension, well-controlled with ramipril  and amlodipine. Blood pressure within target range. Discontinued BC powders, aspirin  used as needed. - Continue ramipril  10 mg daily. - Continue amlodipine 5 mg daily. - Follow up in one year.   Dispo: He can follow-up in a year with Dr. Wonda  Signed, Orren LOISE Fabry, PA-C

## 2024-08-23 DIAGNOSIS — I10 Essential (primary) hypertension: Secondary | ICD-10-CM | POA: Diagnosis not present

## 2024-09-10 DIAGNOSIS — R030 Elevated blood-pressure reading, without diagnosis of hypertension: Secondary | ICD-10-CM | POA: Diagnosis not present

## 2024-10-05 DIAGNOSIS — I4891 Unspecified atrial fibrillation: Secondary | ICD-10-CM | POA: Diagnosis not present

## 2024-10-05 DIAGNOSIS — L989 Disorder of the skin and subcutaneous tissue, unspecified: Secondary | ICD-10-CM | POA: Diagnosis not present

## 2024-10-05 DIAGNOSIS — Z6827 Body mass index (BMI) 27.0-27.9, adult: Secondary | ICD-10-CM | POA: Diagnosis not present

## 2024-10-05 DIAGNOSIS — R413 Other amnesia: Secondary | ICD-10-CM | POA: Diagnosis not present

## 2024-10-05 DIAGNOSIS — B351 Tinea unguium: Secondary | ICD-10-CM | POA: Diagnosis not present

## 2024-10-05 DIAGNOSIS — E782 Mixed hyperlipidemia: Secondary | ICD-10-CM | POA: Diagnosis not present

## 2024-10-05 DIAGNOSIS — I10 Essential (primary) hypertension: Secondary | ICD-10-CM | POA: Diagnosis not present
